# Patient Record
Sex: Female | Born: 1945 | Race: White | Hispanic: No | Smoking: Former smoker
Health system: Southern US, Community
[De-identification: ages and names within clinical notes are randomized; demographics above are authoritative.]

## PROBLEM LIST (undated history)

## (undated) DIAGNOSIS — J449 Chronic obstructive pulmonary disease, unspecified: Secondary | ICD-10-CM

## (undated) DIAGNOSIS — E785 Hyperlipidemia, unspecified: Secondary | ICD-10-CM

## (undated) DIAGNOSIS — R0602 Shortness of breath: Secondary | ICD-10-CM

## (undated) DIAGNOSIS — I10 Essential (primary) hypertension: Secondary | ICD-10-CM

## (undated) DIAGNOSIS — R079 Chest pain, unspecified: Secondary | ICD-10-CM

## (undated) HISTORY — PX: ABDOMINAL HYSTERECTOMY: SHX81

## (undated) HISTORY — DX: Essential (primary) hypertension: I10

## (undated) HISTORY — DX: Hyperlipidemia, unspecified: E78.5

## (undated) HISTORY — DX: Shortness of breath: R06.02

## (undated) HISTORY — DX: Chest pain, unspecified: R07.9

---

## 1999-08-08 ENCOUNTER — Other Ambulatory Visit: Admission: RE | Admit: 1999-08-08 | Discharge: 1999-08-08 | Payer: Self-pay | Admitting: Obstetrics and Gynecology

## 1999-08-18 ENCOUNTER — Ambulatory Visit (HOSPITAL_COMMUNITY): Admission: RE | Admit: 1999-08-18 | Discharge: 1999-08-18 | Payer: Self-pay | Admitting: Obstetrics and Gynecology

## 1999-08-18 ENCOUNTER — Encounter: Payer: Self-pay | Admitting: Obstetrics and Gynecology

## 2001-01-24 ENCOUNTER — Other Ambulatory Visit: Admission: RE | Admit: 2001-01-24 | Discharge: 2001-01-24 | Payer: Self-pay | Admitting: Obstetrics and Gynecology

## 2001-02-05 ENCOUNTER — Ambulatory Visit (HOSPITAL_COMMUNITY): Admission: RE | Admit: 2001-02-05 | Discharge: 2001-02-05 | Payer: Self-pay | Admitting: Obstetrics and Gynecology

## 2001-02-05 ENCOUNTER — Encounter: Payer: Self-pay | Admitting: Obstetrics and Gynecology

## 2011-03-16 ENCOUNTER — Other Ambulatory Visit (HOSPITAL_COMMUNITY): Payer: Self-pay | Admitting: Pulmonary Disease

## 2011-03-16 ENCOUNTER — Ambulatory Visit (HOSPITAL_COMMUNITY)
Admission: RE | Admit: 2011-03-16 | Discharge: 2011-03-16 | Disposition: A | Discharge status: Home / Self Care | Payer: Medicare HMO | Source: Ambulatory Visit | Attending: Pulmonary Disease | Admitting: Pulmonary Disease

## 2011-03-16 DIAGNOSIS — M542 Cervicalgia: Secondary | ICD-10-CM | POA: Insufficient documentation

## 2011-03-16 DIAGNOSIS — R52 Pain, unspecified: Secondary | ICD-10-CM

## 2011-03-16 DIAGNOSIS — M5030 Other cervical disc degeneration, unspecified cervical region: Secondary | ICD-10-CM | POA: Insufficient documentation

## 2011-03-16 DIAGNOSIS — D1779 Benign lipomatous neoplasm of other sites: Secondary | ICD-10-CM | POA: Insufficient documentation

## 2011-03-16 DIAGNOSIS — R079 Chest pain, unspecified: Secondary | ICD-10-CM | POA: Insufficient documentation

## 2011-03-16 DIAGNOSIS — M546 Pain in thoracic spine: Secondary | ICD-10-CM | POA: Insufficient documentation

## 2011-03-16 DIAGNOSIS — M47812 Spondylosis without myelopathy or radiculopathy, cervical region: Secondary | ICD-10-CM | POA: Insufficient documentation

## 2011-04-12 ENCOUNTER — Encounter (HOSPITAL_BASED_OUTPATIENT_CLINIC_OR_DEPARTMENT_OTHER)
Admission: RE | Admit: 2011-04-12 | Discharge: 2011-04-12 | Disposition: A | Discharge status: Home / Self Care | Payer: Medicare HMO | Source: Ambulatory Visit | Attending: Specialist | Admitting: Specialist

## 2011-04-12 LAB — DIFFERENTIAL
Basophils Absolute: 0 10*3/uL (ref 0.0–0.1)
Basophils Relative: 2 % — ABNORMAL HIGH (ref 0–1)
Lymphocytes Relative: 27 % (ref 12–46)
Monocytes Relative: 10 % (ref 3–12)
Neutro Abs: 1.5 10*3/uL — ABNORMAL LOW (ref 1.7–7.7)
Neutrophils Relative %: 57 % (ref 43–77)

## 2011-04-12 LAB — CBC
Hemoglobin: 13.2 g/dL (ref 12.0–15.0)
RBC: 4.33 MIL/uL (ref 3.87–5.11)

## 2011-04-12 LAB — BASIC METABOLIC PANEL
CO2: 26 mEq/L (ref 19–32)
Glucose, Bld: 129 mg/dL — ABNORMAL HIGH (ref 70–99)
Potassium: 3.9 mEq/L (ref 3.5–5.1)
Sodium: 137 mEq/L (ref 135–145)

## 2011-04-16 ENCOUNTER — Other Ambulatory Visit: Payer: Self-pay | Admitting: Specialist

## 2011-04-16 ENCOUNTER — Ambulatory Visit (HOSPITAL_BASED_OUTPATIENT_CLINIC_OR_DEPARTMENT_OTHER)
Admission: RE | Admit: 2011-04-16 | Discharge: 2011-04-16 | Disposition: A | Discharge status: Home / Self Care | Payer: Medicare HMO | Source: Ambulatory Visit | Attending: Specialist | Admitting: Specialist

## 2011-04-16 DIAGNOSIS — D1739 Benign lipomatous neoplasm of skin and subcutaneous tissue of other sites: Secondary | ICD-10-CM | POA: Insufficient documentation

## 2011-04-16 DIAGNOSIS — Z01812 Encounter for preprocedural laboratory examination: Secondary | ICD-10-CM | POA: Insufficient documentation

## 2011-04-16 DIAGNOSIS — E669 Obesity, unspecified: Secondary | ICD-10-CM | POA: Insufficient documentation

## 2011-04-16 DIAGNOSIS — I10 Essential (primary) hypertension: Secondary | ICD-10-CM | POA: Insufficient documentation

## 2011-06-04 NOTE — Op Note (Signed)
  [name], Donna Duran                   ACCOUNT NO.:  [id number]  MEDICAL RECORD NO.:  [id number]  LOCATION:                                 FACILITY:  PHYSICIAN:  Earvin Hansen L. Helen Cuff, M.D.DATE OF BIRTH:  02-Nov-1945  DATE OF PROCEDURE:  04/16/2011 DATE OF DISCHARGE:                              OPERATIVE REPORT   INDICATIONS:  A 65-year lady with severe masses involving her upper mid back areas x2.  Each area measured approximately 8-10 inches across x 8- 10 inches in width.  Increased pain, discomfort the patient says when she lies on them.  Now she has had a lot of pain and the pressure put against these areas also cause her not to be able to flex her neck and back and upper portions as she wishes to.  This is also interfering with her when she drives her car.  PROCEDURES DONE:  Excision of masses of the back, plastic closure.  ANESTHESIA:  General.  DESCRIPTION OF PROCEDURE:  The patient underwent general anesthesia,intubated orally.  She was then placed in a prone position, protected. Prep was done to the back areas with Hibiclens soap and solution, walled off with sterile towels and drapes so as to make a sterile field. Tumescent was used to inject with then the spaces of the 2 masses using approximately 700 mL, able to make an incision, dissect down the skin and subcutaneous tissue to the mass which was subfascial.  I used Metzenbaum scissors to elevate and dissect out and then hemostasis maintained with Bovie anticoagulation.  Flaps then rotated.  The liposuction machine was then used to buff around the edges of the area still removing approximately 500 more mL of tissue from this region using a [place] catheter __________.  We were able to get the mass removed completely flat.  The wound was closed with 3-0 Monocryl x2 layers and a running subcuticular stitch of 3-0 Monocryl leaving the part of the edges opened for drainage.  The wounds were covered with 4x4s, ABDs, Hypafix  tape for pressure dressing.  She withstood the procedures very well and was taken to recovery in excellent condition.     Yaakov Guthrie. Shon Hough, M.D.     Cathie Hoops  D:  04/16/2011  T:  04/16/2011  Job:  [id number]  Electronically Signed by Louisa Second M.D. on 06/04/2011 07:09:39 PM

## 2011-09-18 ENCOUNTER — Other Ambulatory Visit (HOSPITAL_COMMUNITY): Payer: Self-pay | Admitting: Pulmonary Disease

## 2011-09-18 DIAGNOSIS — Z1231 Encounter for screening mammogram for malignant neoplasm of breast: Secondary | ICD-10-CM

## 2011-10-11 ENCOUNTER — Ambulatory Visit (HOSPITAL_COMMUNITY): Payer: Medicare HMO

## 2011-12-28 ENCOUNTER — Other Ambulatory Visit: Payer: Self-pay | Admitting: Gastroenterology

## 2012-11-10 ENCOUNTER — Encounter: Payer: Self-pay | Admitting: *Deleted

## 2012-11-11 ENCOUNTER — Encounter: Payer: Self-pay | Admitting: Internal Medicine

## 2012-12-09 ENCOUNTER — Other Ambulatory Visit (HOSPITAL_COMMUNITY): Payer: Self-pay | Admitting: Internal Medicine

## 2012-12-09 DIAGNOSIS — M7120 Synovial cyst of popliteal space [Baker], unspecified knee: Secondary | ICD-10-CM

## 2012-12-11 ENCOUNTER — Ambulatory Visit (HOSPITAL_COMMUNITY)
Admission: RE | Admit: 2012-12-11 | Discharge: 2012-12-11 | Disposition: A | Discharge status: Home / Self Care | Payer: Medicare HMO | Source: Ambulatory Visit | Attending: Cardiovascular Disease | Admitting: Cardiovascular Disease

## 2012-12-11 DIAGNOSIS — M7120 Synovial cyst of popliteal space [Baker], unspecified knee: Secondary | ICD-10-CM | POA: Insufficient documentation

## 2012-12-11 NOTE — Progress Notes (Signed)
Lower Extremity Venous  Insufficiency Duplex Completed. Positive for reflux in the left greater saphenous vein in the distal thigh and proximal calf region. Donna Duran

## 2012-12-31 ENCOUNTER — Ambulatory Visit (INDEPENDENT_AMBULATORY_CARE_PROVIDER_SITE_OTHER): Payer: Medicare HMO | Admitting: Internal Medicine

## 2012-12-31 ENCOUNTER — Encounter: Payer: Self-pay | Admitting: Internal Medicine

## 2012-12-31 VITALS — BP 120/80 | HR 72 | Ht 62.5 in | Wt 232.1 lb

## 2012-12-31 DIAGNOSIS — E785 Hyperlipidemia, unspecified: Secondary | ICD-10-CM

## 2012-12-31 DIAGNOSIS — I83893 Varicose veins of bilateral lower extremities with other complications: Secondary | ICD-10-CM

## 2012-12-31 DIAGNOSIS — I10 Essential (primary) hypertension: Secondary | ICD-10-CM

## 2012-12-31 NOTE — Patient Instructions (Signed)
Your physician recommends that you schedule a follow-up appointment in 1 month   Wear left leg compression stocking daily and remove at night.    Left ankle 8 inches        Left calf  16.75

## 2013-01-01 ENCOUNTER — Encounter: Payer: Self-pay | Admitting: Internal Medicine

## 2013-01-01 DIAGNOSIS — E785 Hyperlipidemia, unspecified: Secondary | ICD-10-CM | POA: Insufficient documentation

## 2013-01-01 DIAGNOSIS — I83893 Varicose veins of bilateral lower extremities with other complications: Secondary | ICD-10-CM | POA: Insufficient documentation

## 2013-01-01 DIAGNOSIS — I10 Essential (primary) hypertension: Secondary | ICD-10-CM | POA: Insufficient documentation

## 2013-01-01 NOTE — Progress Notes (Signed)
OFFICE NOTE  Chief Complaint:  Followup of venous insufficiency study  Primary Care Physician: Donna Farber, MD  HPI:  Donna Duran  Is a 67 year old female who was previously followed by Dr. Clarene Duran for a history of hypertension and dyslipidemia. She also has morbid obesity and bilateral osteoarthritis and a left Baker's cyst. She has been seen by Dr. Cleophas Duran for her knees and has been injected but is not thought to need knee replacement at this time. She continues to have leg pain bilaterally and feels that it may be a circulatory problem and is here to investigate that today. She did have stigmata of chronic venous insufficiency, and I recommended venous Dopplers. Venous Dopplers demonstrated a small area of venous reflux in the left greater saphenous vein between the calf and just to above the knee. This was a small vessel, measuring just over 3 mm. However the greater saphenous vein is up to 6-7 mm in size in the mid thigh.  We discussed her symptoms which include left leg pain, it is difficult to know if this is related to the small Baker cyst which was also noted during ultrasound, measuring about 1 x 3 cm. There was no evidence of cyst rupture.    PMHx:  Past Medical History  Diagnosis Date  . Chest pain     nuclear study 02-2010 that was negative for ischemia, EF 70%  . HTN (hypertension)   . Hyperlipemia   . SOB (shortness of breath)     copd    History reviewed. No pertinent past surgical history.  FAMHx:  Family History  Problem Relation Age of Onset  . Heart failure Mother   . Cancer Paternal Grandfather   . Hypertension Brother   . COPD Sister     SOCHx:   reports that she quit smoking about 30 years ago. Her smoking use included Cigarettes. She smoked 0.00 packs per day. She does not have any smokeless tobacco history on file. Her alcohol and drug histories are not on file.  ALLERGIES:  Allergies  Allergen Reactions  . Morphine And Related      ROS: Pertinent items are noted in HPI.  HOME MEDS: Current Outpatient Prescriptions  Medication Sig Dispense Refill  . b complex vitamins tablet Take 1 tablet by mouth daily.      Marland Kitchen BIOTIN PO Take 1 tablet by mouth.      . Cholecalciferol (VITAMIN D) 2000 UNITS tablet Take 2,000 Units by mouth daily.      . enalapril (VASOTEC) 20 MG tablet Take 20 mg by mouth daily.      Marland Kitchen FLUoxetine (PROZAC) 20 MG capsule Take 20 mg by mouth. Takes 2 tablets daily      . pravastatin (PRAVACHOL) 40 MG tablet Take 40 mg by mouth daily.      Marland Kitchen tiotropium (SPIRIVA) 18 MCG inhalation capsule Place 18 mcg into inhaler and inhale daily.      . traMADol (ULTRAM) 50 MG tablet Take 50 mg by mouth as needed for pain.      . vitamin E (VITAMIN E) 400 UNIT capsule Take 400 Units by mouth daily.      Marland Kitchen zolpidem (AMBIEN) 10 MG tablet Take 10 mg by mouth at bedtime as needed for sleep.       No current facility-administered medications for this visit.    LABS/IMAGING: No results found for this or any previous visit (from the past 48 hour(s)). No results found.  VITALS: BP 120/80  Pulse 72  Ht 5' 2.5" (1.588 m)  Wt 232 lb 1.6 oz (105.28 kg)  BMI 41.75 kg/m2  EXAM: Focus physical exam the lower extremities demonstrate swelling in the left leg with left leg pain, fullness in the popliteal area and varicosities with reticular veins  EKG: deferred  ASSESSMENT: 1. CEAP 1,3 venous insufficiency, greater in the left leg  PLAN: 1.   Ms. Donna Duran has significant reflux in the left greater saphenous vein, however the vessel is small and this could represent a perforator. She does report symptoms in that area which could be consistent with reflux disease. We discussed possible treatment options including venous ablation. For now she wishes to try compression stocking and  we'll go ahead and fit her with that. Plan is to bring her back in a month to see if she's had benefit from the compression stocking and if she is  now interested in venous ablation.  Donna Nose, MD, [facility] Attending Cardiologist [facility]  Donna Duran 01/01/2013, 7:19 PM

## 2013-01-12 ENCOUNTER — Other Ambulatory Visit: Payer: Self-pay | Admitting: Pulmonary Disease

## 2013-01-12 DIAGNOSIS — Z1231 Encounter for screening mammogram for malignant neoplasm of breast: Secondary | ICD-10-CM

## 2013-01-28 ENCOUNTER — Ambulatory Visit (INDEPENDENT_AMBULATORY_CARE_PROVIDER_SITE_OTHER): Payer: Medicare HMO | Admitting: Internal Medicine

## 2013-01-28 ENCOUNTER — Encounter: Payer: Self-pay | Admitting: Internal Medicine

## 2013-01-28 VITALS — BP 132/84 | HR 61 | Ht 63.0 in | Wt 234.3 lb

## 2013-01-28 DIAGNOSIS — I83893 Varicose veins of bilateral lower extremities with other complications: Secondary | ICD-10-CM

## 2013-01-28 DIAGNOSIS — R079 Chest pain, unspecified: Secondary | ICD-10-CM

## 2013-01-28 MED ORDER — DIAZEPAM 5 MG PO TABS: ORAL_TABLET | ORAL | Status: DC

## 2013-01-28 NOTE — Progress Notes (Signed)
OFFICE NOTE  Chief Complaint:  Followup of venous insufficiency study  Primary Care Physician: Eino Farber, MD  HPI:  Donna Duran  Is a 67 year old female who was previously followed by Dr. Clarene Duke for a history of hypertension and dyslipidemia. She also has morbid obesity and bilateral osteoarthritis and a left Baker's cyst. She has been seen by Dr. Cleophas Dunker for her knees and has been injected but is not thought to need knee replacement at this time. She continues to have leg pain bilaterally and feels that it may be a circulatory problem and is here to investigate that today. She did have stigmata of chronic venous insufficiency, and I recommended venous Dopplers. Venous Dopplers demonstrated a small area of venous reflux in the left greater saphenous vein between the calf and just to above the knee. This was a small vessel, measuring just over 3 mm. However the greater saphenous vein is up to 6-7 mm in size in the mid thigh.  We discussed her symptoms which include left leg pain, it is difficult to know if this is related to the small Baker cyst which was also noted during ultrasound, measuring about 1 x 3 cm. There was no evidence of cyst rupture.    PMHx:  Past Medical History  Diagnosis Date  . Chest pain     nuclear study 02-2010 that was negative for ischemia, EF 70%  . HTN (hypertension)   . Hyperlipemia   . SOB (shortness of breath)     copd    History reviewed. No pertinent past surgical history.  FAMHx:  Family History  Problem Relation Age of Onset  . Heart failure Mother   . Cancer Paternal Grandfather   . Hypertension Brother   . COPD Sister     SOCHx:   reports that she quit smoking about 30 years ago. Her smoking use included Cigarettes. She smoked 0.00 packs per day. She does not have any smokeless tobacco history on file. Her alcohol and drug histories are not on file.  ALLERGIES:  Allergies  Allergen Reactions  . Morphine And Related      ROS: Pertinent items are noted in HPI.  HOME MEDS: Current Outpatient Prescriptions  Medication Sig Dispense Refill  . b complex vitamins tablet Take 1 tablet by mouth daily.      Marland Kitchen BIOTIN PO Take 1 tablet by mouth.      . Cholecalciferol (VITAMIN D) 2000 UNITS tablet Take 2,000 Units by mouth daily.      . enalapril (VASOTEC) 20 MG tablet Take 20 mg by mouth daily.      Marland Kitchen FLUoxetine (PROZAC) 20 MG capsule Take 20 mg by mouth. Takes 2 tablets daily      . pravastatin (PRAVACHOL) 40 MG tablet Take 40 mg by mouth daily.      Marland Kitchen tiotropium (SPIRIVA) 18 MCG inhalation capsule Place 18 mcg into inhaler and inhale daily.      . traMADol (ULTRAM) 50 MG tablet Take 50 mg by mouth as needed for pain.      . vitamin E (VITAMIN E) 400 UNIT capsule Take 400 Units by mouth daily.      Marland Kitchen zolpidem (AMBIEN) 10 MG tablet Take 10 mg by mouth at bedtime as needed for sleep.      . diazepam (VALIUM) 5 MG tablet Take 1 tablet (5mg ) 2 hours prior to procedure.  1 tablet  0   No current facility-administered medications for this visit.    LABS/IMAGING: No  results found for this or any previous visit (from the past 48 hour(s)). No results found.  VITALS: BP 132/84  Pulse 61  Ht 5\' 3"  (1.6 m)  Wt 234 lb 4.8 oz (106.278 kg)  BMI 41.51 kg/m2  EXAM: Focus physical exam the lower extremities demonstrate swelling in the left leg with left leg pain, fullness in the popliteal area and varicosities with reticular veins  EKG: deferred  ASSESSMENT: 1. CEAP 1,3 venous insufficiency, greater in the left leg  PLAN: 1.   Ms. Cremeans has significant reflux in the left greater saphenous vein and has had some improvement with a compression stocking. She is now interested in venous ablation, due to the fact that she is at swelling in her left leg mostly when up and her feet. This is also accompanied by pain and heaviness. We discussed the risk and benefits of the procedure today and she wishes to proceed. She did  provide informed consent and will protrude insurance company for preauthorization. I suspect we can schedule her with our pain clinic on August 6.  Chrystie Nose, MD, [facility] Attending Cardiologist [facility]  Abdalla Naramore C 01/28/2013, 6:19 PM

## 2013-01-28 NOTE — Patient Instructions (Addendum)
Dr. Rennis Golden has ordered a venous ablation. You have a prescription for Valium 5mg  - please take this 2 hours prior to the procedure. You will need to have someone drive you to and from this procedure.

## 2013-02-09 ENCOUNTER — Ambulatory Visit
Admission: RE | Admit: 2013-02-09 | Discharge: 2013-02-09 | Disposition: A | Discharge status: Home / Self Care | Payer: Medicare HMO | Source: Ambulatory Visit | Attending: Pulmonary Disease | Admitting: Pulmonary Disease

## 2013-02-09 DIAGNOSIS — Z1231 Encounter for screening mammogram for malignant neoplasm of breast: Secondary | ICD-10-CM

## 2013-02-26 ENCOUNTER — Other Ambulatory Visit: Payer: Self-pay | Admitting: *Deleted

## 2013-02-26 DIAGNOSIS — I83893 Varicose veins of bilateral lower extremities with other complications: Secondary | ICD-10-CM

## 2013-03-04 ENCOUNTER — Encounter: Payer: Medicare HMO | Admitting: Internal Medicine

## 2013-03-06 ENCOUNTER — Encounter (HOSPITAL_COMMUNITY): Payer: Medicare HMO

## 2013-03-23 ENCOUNTER — Ambulatory Visit: Payer: Medicare HMO | Admitting: Internal Medicine

## 2013-03-24 ENCOUNTER — Ambulatory Visit: Payer: Medicare HMO | Admitting: Internal Medicine

## 2013-03-24 ENCOUNTER — Other Ambulatory Visit (HOSPITAL_COMMUNITY): Payer: Self-pay | Admitting: Pulmonary Disease

## 2013-03-24 DIAGNOSIS — M25569 Pain in unspecified knee: Secondary | ICD-10-CM

## 2013-03-25 ENCOUNTER — Ambulatory Visit (HOSPITAL_COMMUNITY)
Admission: RE | Admit: 2013-03-25 | Discharge: 2013-03-25 | Disposition: A | Discharge status: Home / Self Care | Payer: Medicare HMO | Source: Ambulatory Visit | Attending: Pulmonary Disease | Admitting: Pulmonary Disease

## 2013-03-25 ENCOUNTER — Other Ambulatory Visit (HOSPITAL_COMMUNITY): Payer: Self-pay | Admitting: Pulmonary Disease

## 2013-03-25 DIAGNOSIS — M25569 Pain in unspecified knee: Secondary | ICD-10-CM

## 2013-03-25 DIAGNOSIS — R52 Pain, unspecified: Secondary | ICD-10-CM

## 2013-03-25 DIAGNOSIS — IMO0002 Reserved for concepts with insufficient information to code with codable children: Secondary | ICD-10-CM | POA: Insufficient documentation

## 2013-03-25 DIAGNOSIS — E785 Hyperlipidemia, unspecified: Secondary | ICD-10-CM | POA: Insufficient documentation

## 2013-03-25 DIAGNOSIS — M79609 Pain in unspecified limb: Secondary | ICD-10-CM

## 2013-03-25 DIAGNOSIS — I70219 Atherosclerosis of native arteries of extremities with intermittent claudication, unspecified extremity: Secondary | ICD-10-CM

## 2013-03-25 DIAGNOSIS — M5380 Other specified dorsopathies, site unspecified: Secondary | ICD-10-CM | POA: Insufficient documentation

## 2013-03-25 DIAGNOSIS — I10 Essential (primary) hypertension: Secondary | ICD-10-CM | POA: Insufficient documentation

## 2013-03-25 NOTE — Progress Notes (Signed)
VASCULAR LAB PRELIMINARY  ARTERIAL  ABI completed:    RIGHT    LEFT    PRESSURE WAVEFORM  PRESSURE WAVEFORM  BRACHIAL 157 Biphasic BRACHIAL 156 Triphasic  DP 145 Biphasic DP 150 Biphasic  AT   AT    PT 156 Biphasic PT 139 Monophasic  PER   PER    GREAT TOE  NA GREAT TOE  NA    RIGHT LEFT  ABI 0.99 0.96    Bilateral ABIs are within normal limits.  03/25/2013 2:38 PM Gertie Fey, RVT, RDCS, RDMS

## 2013-04-01 ENCOUNTER — Ambulatory Visit: Payer: Medicare HMO | Admitting: Internal Medicine

## 2013-04-15 ENCOUNTER — Other Ambulatory Visit: Payer: Self-pay | Admitting: Pulmonary Disease

## 2013-04-15 DIAGNOSIS — G548 Other nerve root and plexus disorders: Secondary | ICD-10-CM

## 2013-04-21 ENCOUNTER — Encounter: Payer: Medicare HMO | Admitting: Internal Medicine

## 2013-04-22 ENCOUNTER — Other Ambulatory Visit: Payer: Medicare HMO

## 2013-04-23 ENCOUNTER — Encounter (HOSPITAL_COMMUNITY): Payer: Medicare HMO

## 2013-04-23 ENCOUNTER — Other Ambulatory Visit: Payer: Medicare HMO

## 2013-05-04 ENCOUNTER — Ambulatory Visit
Admission: RE | Admit: 2013-05-04 | Discharge: 2013-05-04 | Disposition: A | Discharge status: Home / Self Care | Payer: Medicare HMO | Source: Ambulatory Visit | Attending: Pulmonary Disease | Admitting: Pulmonary Disease

## 2013-05-04 DIAGNOSIS — G548 Other nerve root and plexus disorders: Secondary | ICD-10-CM

## 2013-05-04 MED ORDER — GADOBENATE DIMEGLUMINE 529 MG/ML IV SOLN
20.0000 mL | Freq: Once | INTRAVENOUS | Status: AC | PRN
  Administered 2013-05-04: 20 mL via INTRAVENOUS

## 2013-05-13 ENCOUNTER — Ambulatory Visit: Payer: Medicare HMO | Admitting: Internal Medicine

## 2014-02-02 ENCOUNTER — Other Ambulatory Visit: Payer: Self-pay | Admitting: Pulmonary Disease

## 2014-02-02 DIAGNOSIS — Z1231 Encounter for screening mammogram for malignant neoplasm of breast: Secondary | ICD-10-CM

## 2014-02-04 ENCOUNTER — Telehealth: Payer: Self-pay | Admitting: Internal Medicine

## 2014-02-04 NOTE — Telephone Encounter (Signed)
Closed encounter °

## 2014-02-10 ENCOUNTER — Ambulatory Visit
Admission: RE | Admit: 2014-02-10 | Discharge: 2014-02-10 | Disposition: A | Discharge status: Home / Self Care | Payer: Commercial Managed Care - HMO | Source: Ambulatory Visit | Attending: Pulmonary Disease | Admitting: Pulmonary Disease

## 2014-02-10 DIAGNOSIS — Z1231 Encounter for screening mammogram for malignant neoplasm of breast: Secondary | ICD-10-CM

## 2014-02-12 ENCOUNTER — Encounter: Payer: Self-pay | Admitting: Internal Medicine

## 2014-02-12 ENCOUNTER — Ambulatory Visit (INDEPENDENT_AMBULATORY_CARE_PROVIDER_SITE_OTHER): Payer: Medicare HMO | Admitting: Internal Medicine

## 2014-02-12 VITALS — BP 150/70 | HR 62 | Ht 62.0 in | Wt 230.5 lb

## 2014-02-12 DIAGNOSIS — I83893 Varicose veins of bilateral lower extremities with other complications: Secondary | ICD-10-CM

## 2014-02-12 DIAGNOSIS — R0609 Other forms of dyspnea: Secondary | ICD-10-CM

## 2014-02-12 DIAGNOSIS — I10 Essential (primary) hypertension: Secondary | ICD-10-CM

## 2014-02-12 DIAGNOSIS — R0602 Shortness of breath: Secondary | ICD-10-CM | POA: Insufficient documentation

## 2014-02-12 DIAGNOSIS — E785 Hyperlipidemia, unspecified: Secondary | ICD-10-CM

## 2014-02-12 DIAGNOSIS — R0989 Other specified symptoms and signs involving the circulatory and respiratory systems: Secondary | ICD-10-CM

## 2014-02-12 NOTE — Progress Notes (Signed)
OFFICE NOTE  Chief Complaint:  Followup of venous insufficiency study  Primary Care Physician: Leola Brazil, MD  HPI:  Donna Duran  Is a 68 year old female who was previously followed by Dr. Rex Kras for a history of hypertension and dyslipidemia. She also has morbid obesity and bilateral osteoarthritis and a left Baker's cyst. She has been seen by Dr. Durward Fortes for her knees and has been injected but is not thought to need knee replacement at this time. She continues to have leg pain bilaterally and feels that it may be a circulatory problem and is here to investigate that today. She did have stigmata of chronic venous insufficiency, and I recommended venous Dopplers. Venous Dopplers demonstrated a small area of venous reflux in the left greater saphenous vein between the calf and just to above the knee. This was a small vessel, measuring just over 3 mm. However the greater saphenous vein is up to 6-7 mm in size in the mid thigh.  We discussed her symptoms which include left leg pain, it is difficult to know if this is related to the small Baker cyst which was also noted during ultrasound, measuring about 1 x 3 cm. There was no evidence of cyst rupture.    Donna Duran returns today for followup. She reports a short while ago she had an episode of a significant GI upset. She was sick for about a week and then presented to her primary care doctor. They did an EKG and told her that her "heart was strained". She denied any chest pain he has had no further episodes since then. She does get short of breath but this is stable for her and not worsening. She reports her cholesterol recently checked her total cholesterol was just over 100 which represents very good control.  PMHx:  Past Medical History  Diagnosis Date  . Chest pain     nuclear study 02-2010 that was negative for ischemia, EF 70%  . HTN (hypertension)   . Hyperlipemia   . SOB (shortness of breath)     copd     History reviewed. No pertinent past surgical history.  FAMHx:  Family History  Problem Relation Age of Onset  . Heart failure Mother   . Cancer Paternal Grandfather   . Hypertension Brother   . COPD Sister     SOCHx:   reports that she quit smoking about 31 years ago. Her smoking use included Cigarettes. She smoked 0.00 packs per day. She does not have any smokeless tobacco history on file. Her alcohol and drug histories are not on file.  ALLERGIES:  Allergies  Allergen Reactions  . Morphine And Related     ROS: A comprehensive review of systems was negative except for: Respiratory: positive for dyspnea on exertion  HOME MEDS: Current Outpatient Prescriptions  Medication Sig Dispense Refill  . b complex vitamins tablet Take 1 tablet by mouth daily.      Marland Kitchen BIOTIN PO Take 1 tablet by mouth.      . Cholecalciferol (VITAMIN D) 2000 UNITS tablet Take 2,000 Units by mouth daily.      . enalapril (VASOTEC) 20 MG tablet Take 20 mg by mouth daily.      Marland Kitchen FLUoxetine (PROZAC) 20 MG capsule Take 20 mg by mouth. Takes 2 tablets daily      . pravastatin (PRAVACHOL) 40 MG tablet Take 40 mg by mouth daily.      Marland Kitchen tiotropium (SPIRIVA) 18 MCG inhalation capsule Place 18  mcg into inhaler and inhale daily.      . traMADol (ULTRAM) 50 MG tablet Take 50 mg by mouth as needed for pain.      . vitamin E (VITAMIN E) 400 UNIT capsule Take 400 Units by mouth daily.      Marland Kitchen zolpidem (AMBIEN) 10 MG tablet Take 10 mg by mouth at bedtime as needed for sleep.       No current facility-administered medications for this visit.    LABS/IMAGING: No results found for this or any previous visit (from the past 48 hour(s)). Mm Digital Screening Bilateral  02/11/2014   CLINICAL DATA:  Screening.  EXAM: DIGITAL SCREENING BILATERAL MAMMOGRAM WITH CAD  COMPARISON:  Previous exam(s)  ACR Breast Density Category a: The breast tissue is almost entirely fatty.  FINDINGS: There are no findings suspicious for  malignancy. Images were processed with CAD.  IMPRESSION: No mammographic evidence of malignancy. A result letter of this screening mammogram will be mailed directly to the patient.  RECOMMENDATION: Screening mammogram in one year. (Code:[id number]-B-01Y)  BI-RADS CATEGORY  1: Negative.   Electronically Signed   By: Hassan Rowan M.D.   On: 02/11/2014 13:58    VITALS: BP 150/70  Pulse 62  Ht 5\' 2"  (1.575 m)  Wt 230 lb 8 oz (104.554 kg)  BMI 42.15 kg/m2  EXAM: General appearance: alert and no distress Neck: no carotid bruit, no JVD and thyroid not enlarged, symmetric, no tenderness/mass/nodules Lungs: clear to auscultation bilaterally Heart: regular rate and rhythm, S1, S2 normal, no murmur, click, rub or gallop Abdomen: soft, non-tender; bowel sounds normal; no masses,  no organomegaly Extremities: extremities normal, atraumatic, no cyanosis or edema Pulses: 2+ and symmetric Skin: Skin color, texture, turgor normal. No rashes or lesions Neurologic: Grossly normal Focus physical exam the lower extremities demonstrate swelling in the left leg with left leg pain, fullness in the popliteal area and varicosities with reticular veins  EKG: Normal sinus rhythm at 62  ASSESSMENT: 1. HTN 2. Dyslipidemia 3. Stable DOE 4. CEAP 1,3 venous insufficiency, greater in the left leg 5. Morbid obesity  PLAN: 1.   [name] is doing well. Her blood pressures controlled. Her cholesterol is at goal. She is stable breathlessness, but no chest pain. She apparently had an abnormal EKG however her EKG today is normal. There is no sign of ischemia. Her last stress test was in 2011 and was negative. I do not see an indication for repeat stress testing at this time. She does have venous insufficiency and we never went ahead and performed a venous ablation however she reports her symptoms are manageable at this time. Plan to see her back annually or sooner as necessary.  Pixie Casino, MD, [facility] Attending Cardiologist The  [place] C 02/12/2014, 3:17 PM

## 2014-02-12 NOTE — Patient Instructions (Signed)
Your physician wants you to follow-up in: 1 year. You will receive a reminder letter in the mail two months in advance. If you don't receive a letter, please call our office to schedule the follow-up appointment.  

## 2015-06-27 IMAGING — CR DG THORACIC SPINE 4+V
3 series · 3 of 3 positions shown · non-contrast
Comparison: None

CLINICAL DATA: Pain between scapulae

THORACIC SPINE - 4+ VIEW

[t t-spine a.p.]
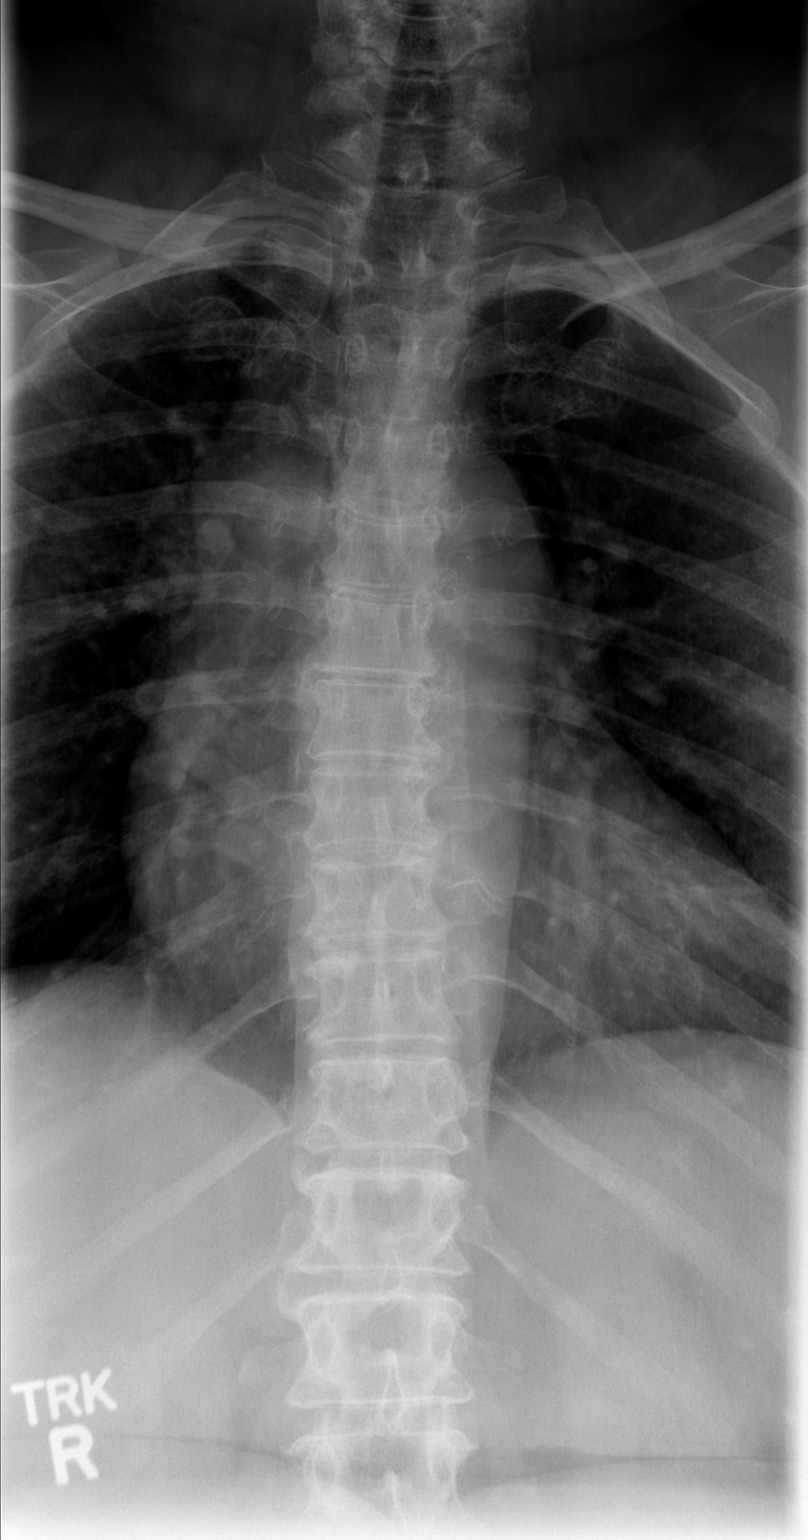

[t t-spine lat]
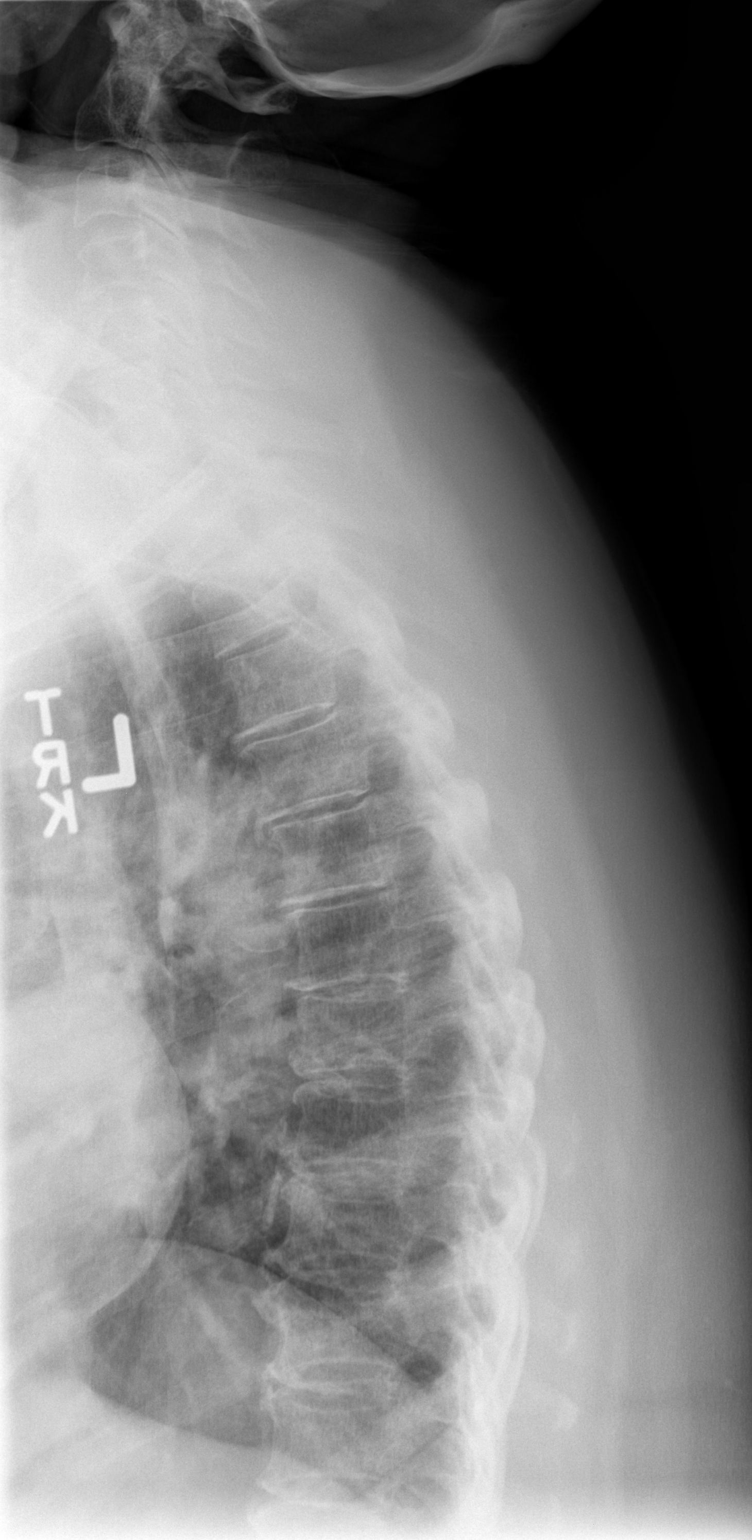

[t swimmers]
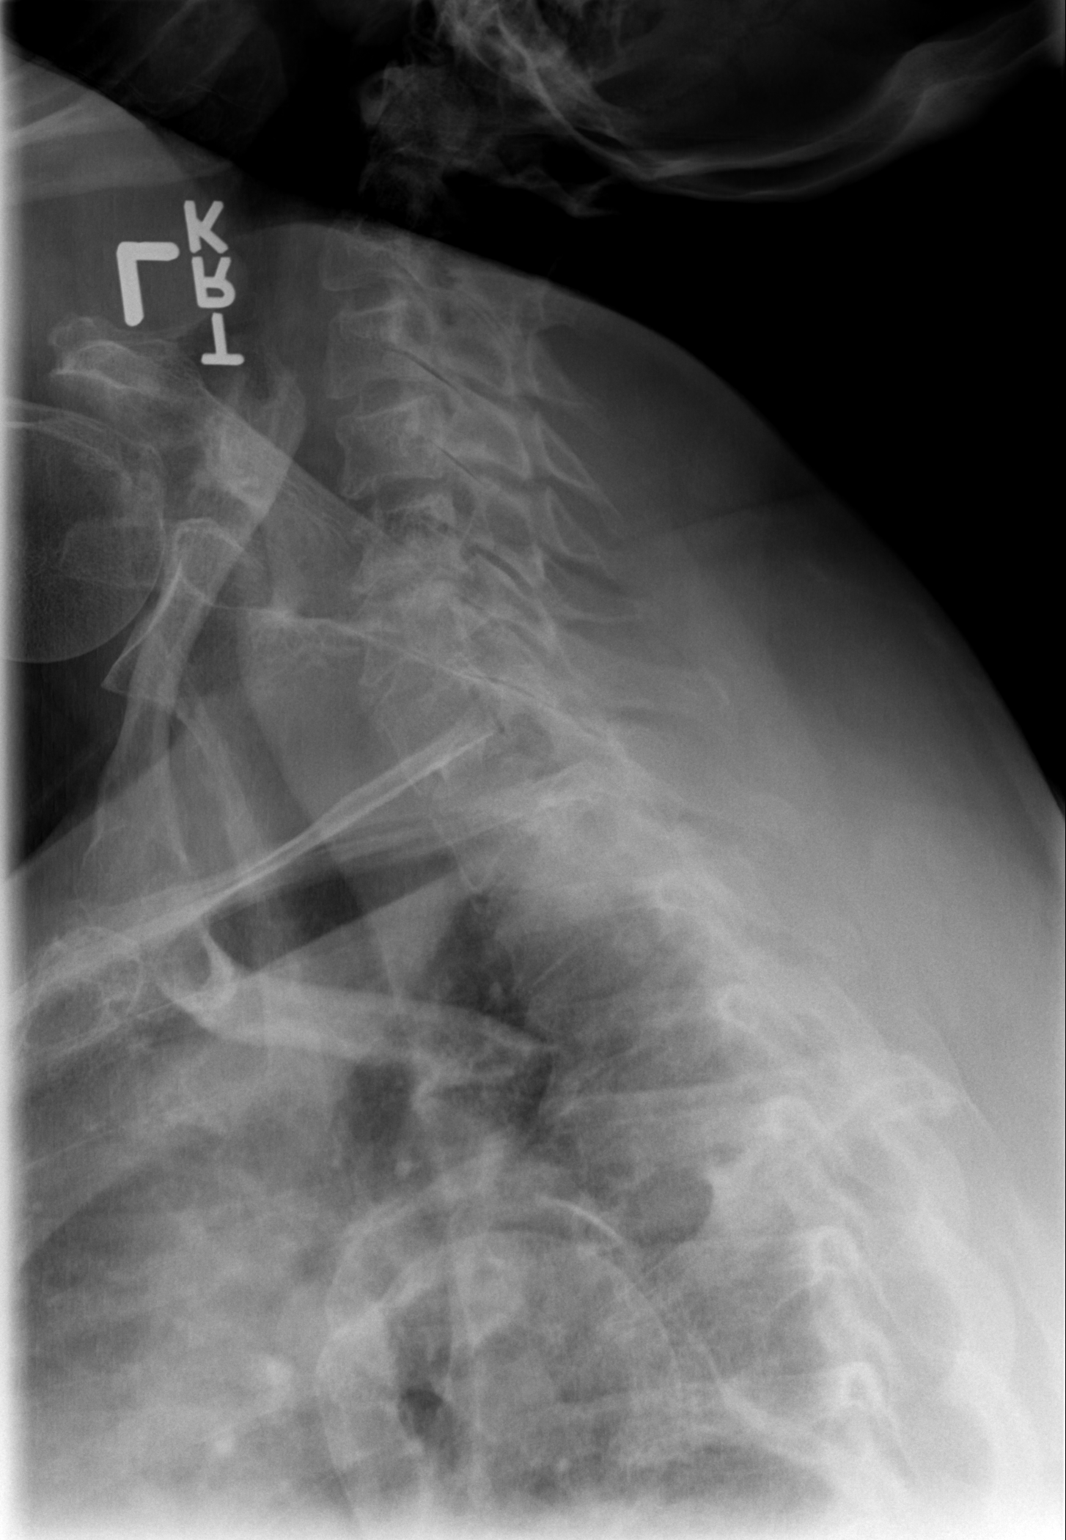

[3 of 3 positions shown; findings below may reference images not displayed]

FINDINGS: 12 pairs of ribs.
Osseous mineralization grossly normal.
Minimal scattered endplate spur formation at mid to inferior
thoracic spine.
Vertebral body heights maintained without fracture or bone
destruction.
Minimal broad-based dextroconvex thoracic scoliosis.
Visualized posterior ribs unremarkable.
IMPRESSION: Minimal degenerative disc disease changes thoracic spine.

## 2015-06-27 IMAGING — CR DG LUMBAR SPINE COMPLETE 4+V
5 series · 5 of 5 positions shown · non-contrast
Comparison: None

CLINICAL DATA: Severe low back pain at L4-L5 and down both legs,
difficulty standing straight and walking, no known injury

LUMBAR SPINE - COMPLETE 4+ VIEW

[t l-spine a.p.]
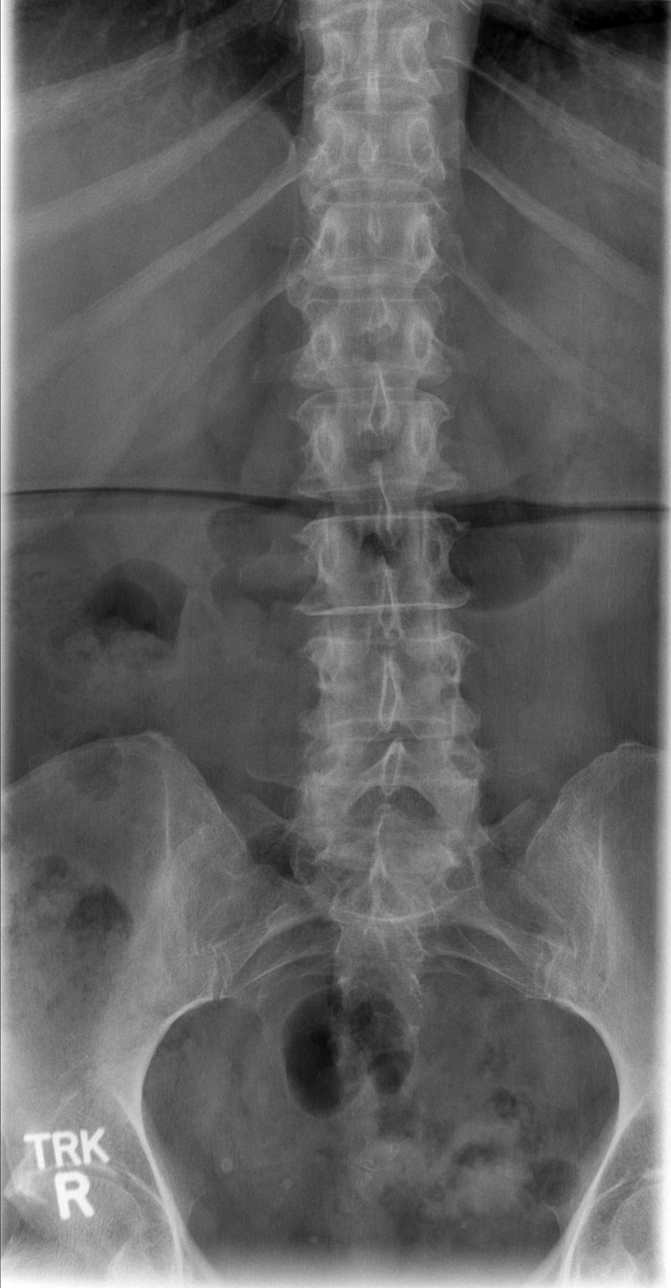

[t l-spine oblique exposure (1 of 2)]
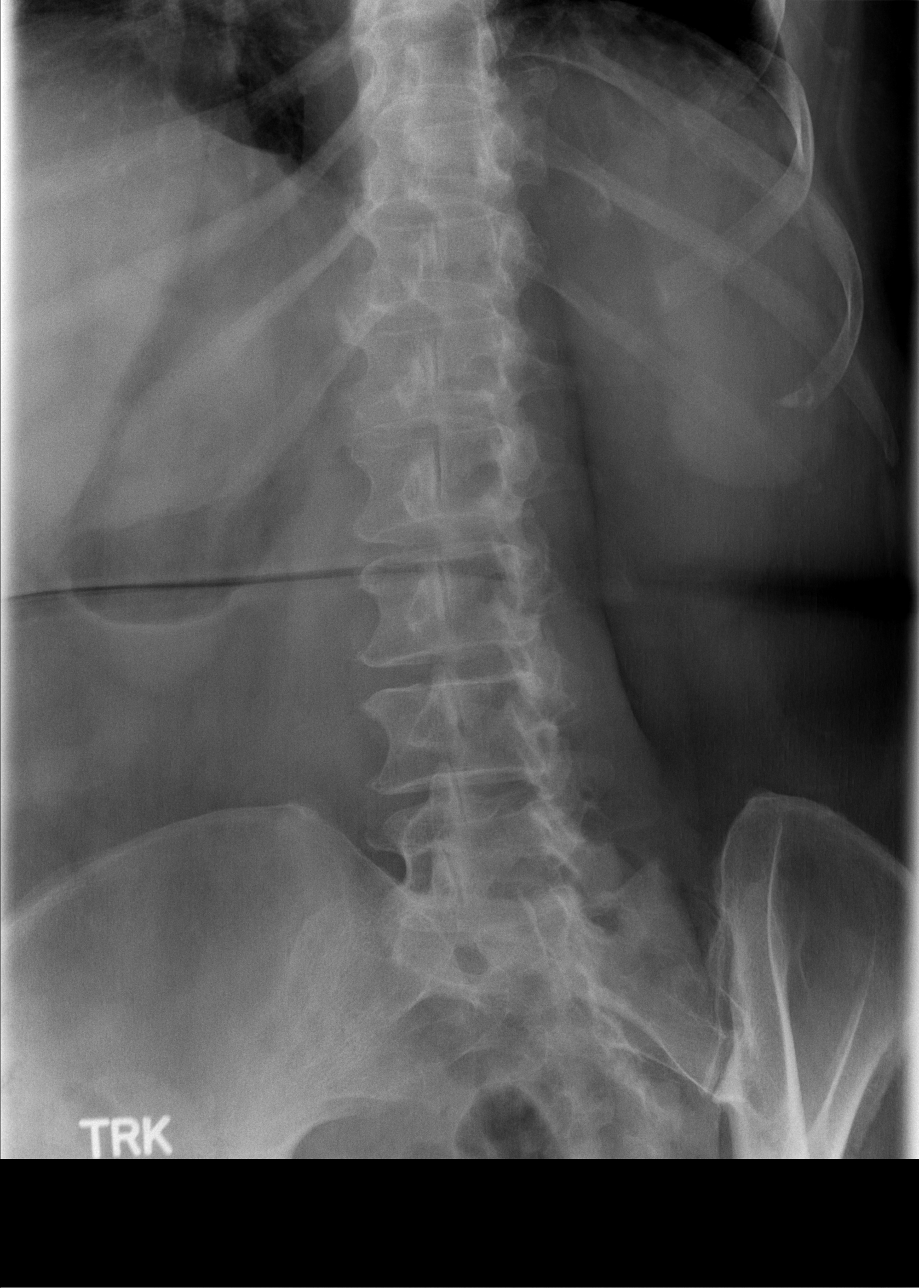

[t l-spine oblique exposure (2 of 2)]
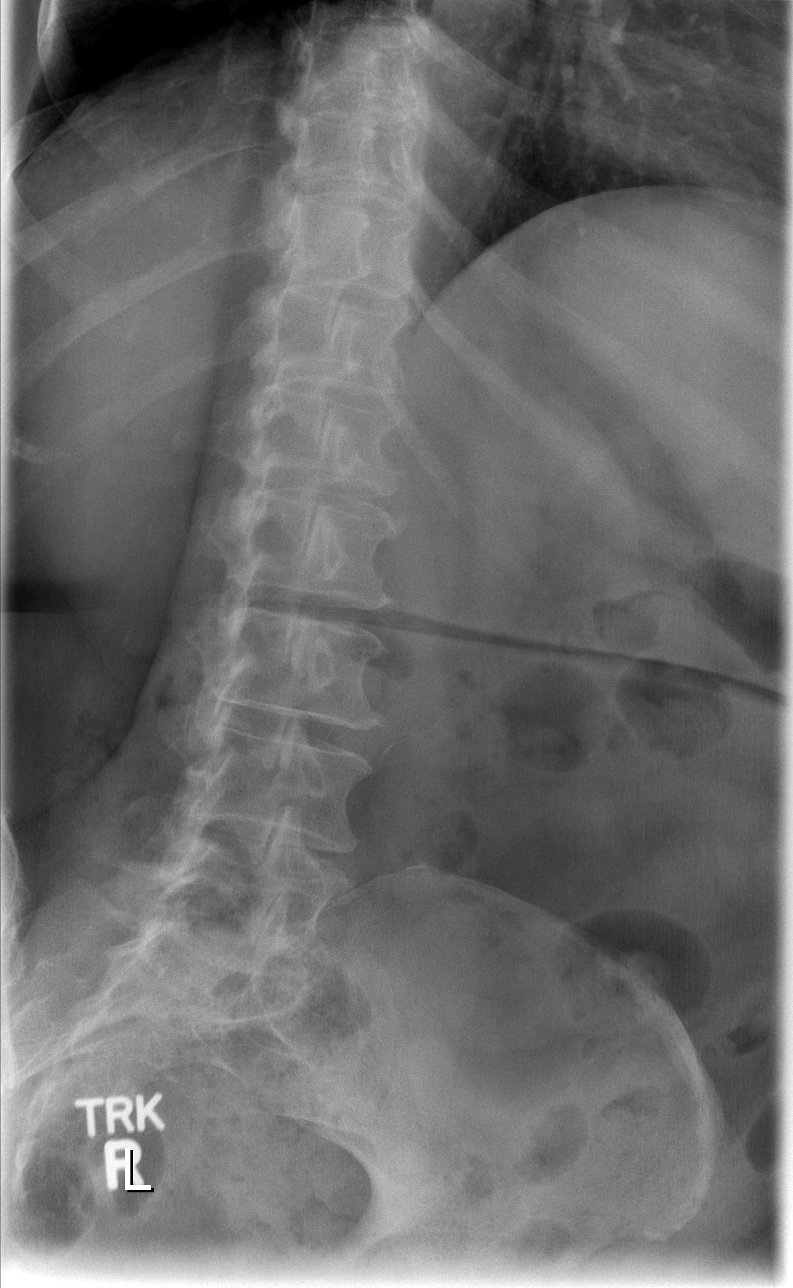

[t l-spine lat]
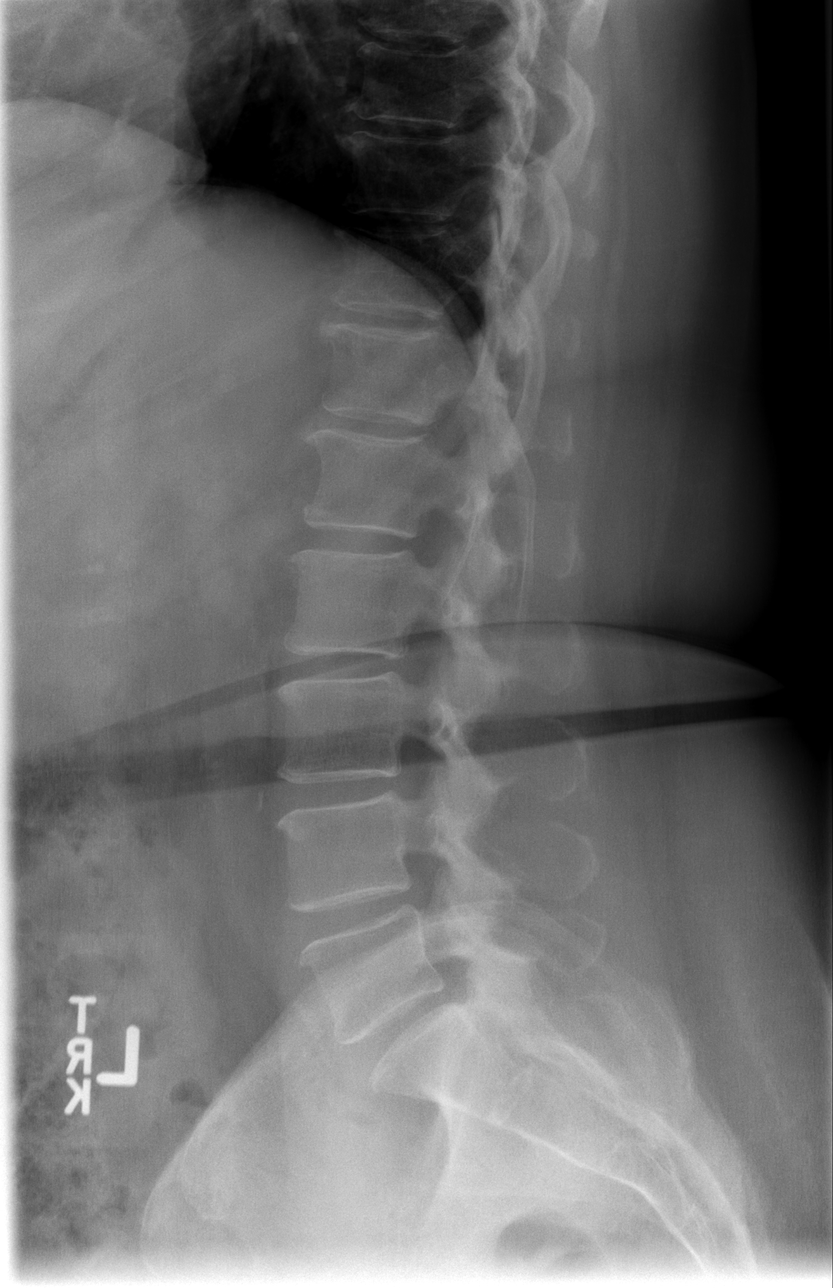

[t l-spine l5-s1 spot]
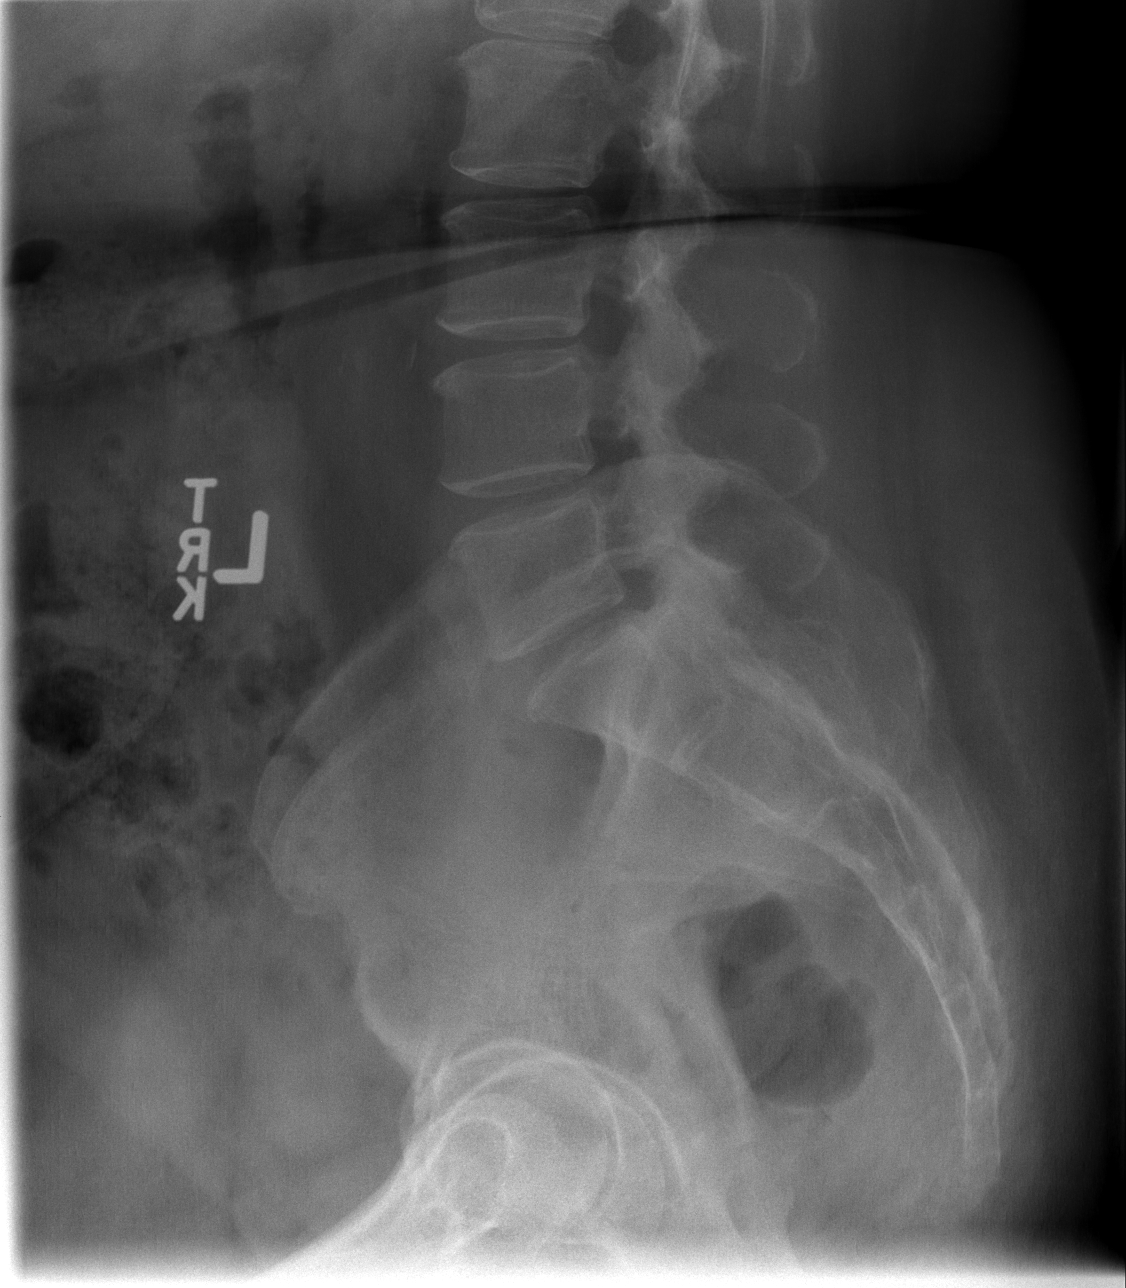

[5 of 5 positions shown; findings below may reference images not displayed]

FINDINGS: Mild osseous demineralization.
Five non-rib bearing lumbar vertebrae.
Vertebral body and disc space heights maintained.
No acute fracture, subluxation or bone destruction.
Very tiny superior endplate spurs throughout lumbar region.
No spondylolysis.
SI joints symmetric.
IMPRESSION: No acute lumbar spine abnormalities.
Minimal scattered endplate spur formation.
If the patient has radicular symptoms consider MR imaging without
contrast for further evaluation.

## 2016-03-02 ENCOUNTER — Other Ambulatory Visit: Payer: Self-pay | Admitting: Pulmonary Disease

## 2016-03-02 DIAGNOSIS — Z1231 Encounter for screening mammogram for malignant neoplasm of breast: Secondary | ICD-10-CM

## 2016-03-13 ENCOUNTER — Ambulatory Visit: Payer: Medicare HMO

## 2017-05-07 ENCOUNTER — Other Ambulatory Visit (HOSPITAL_COMMUNITY): Payer: Self-pay | Admitting: Pulmonary Disease

## 2017-05-07 DIAGNOSIS — N390 Urinary tract infection, site not specified: Secondary | ICD-10-CM

## 2017-05-07 DIAGNOSIS — R350 Frequency of micturition: Secondary | ICD-10-CM

## 2017-05-14 ENCOUNTER — Encounter (HOSPITAL_COMMUNITY): Payer: Self-pay

## 2017-05-14 ENCOUNTER — Ambulatory Visit (HOSPITAL_COMMUNITY)
Admission: RE | Admit: 2017-05-14 | Discharge: 2017-05-14 | Disposition: A | Discharge status: Home / Self Care | Payer: Medicare HMO | Source: Ambulatory Visit | Attending: Pulmonary Disease | Admitting: Pulmonary Disease

## 2017-05-14 DIAGNOSIS — N390 Urinary tract infection, site not specified: Secondary | ICD-10-CM | POA: Diagnosis present

## 2017-05-14 DIAGNOSIS — R350 Frequency of micturition: Secondary | ICD-10-CM | POA: Insufficient documentation

## 2018-01-15 IMAGING — US US RENAL
1 series · 14 of 25 positions shown · non-contrast
Comparison: None.

CLINICAL DATA: 71-year-old female with urinary frequency. UTI.
Hypertension. Initial encounter.

EXAM:
RENAL / URINARY TRACT ULTRASOUND COMPLETE

[Series 1: us renal · 0.23mm/px · 14 of 36 slices shown]
[im 1/36]
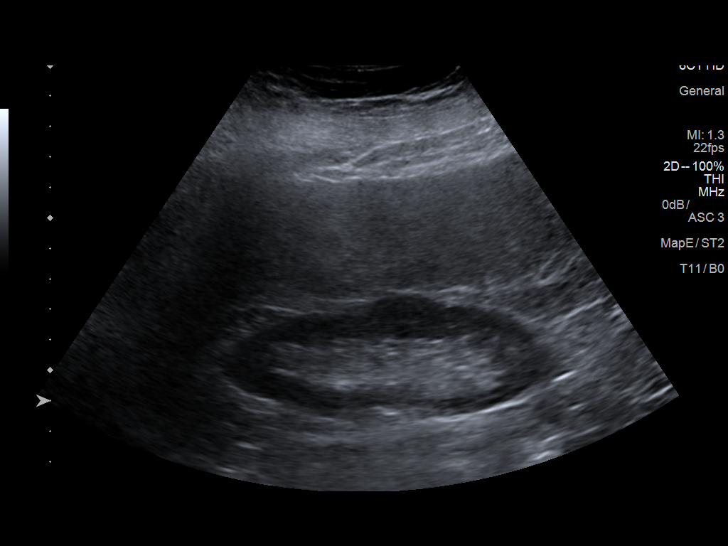
[im 3/36]
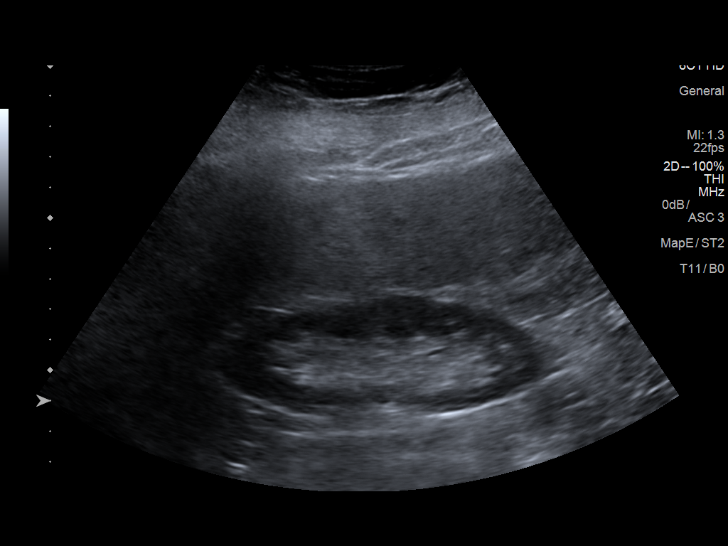
[im 6/36]
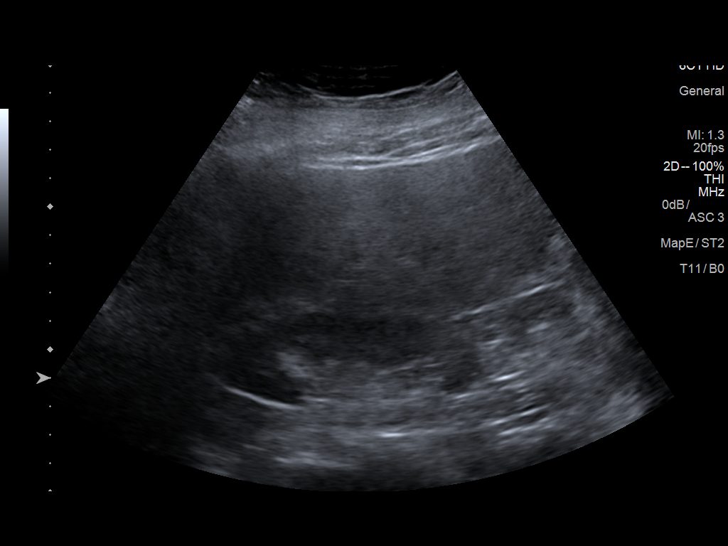
[im 9/36]
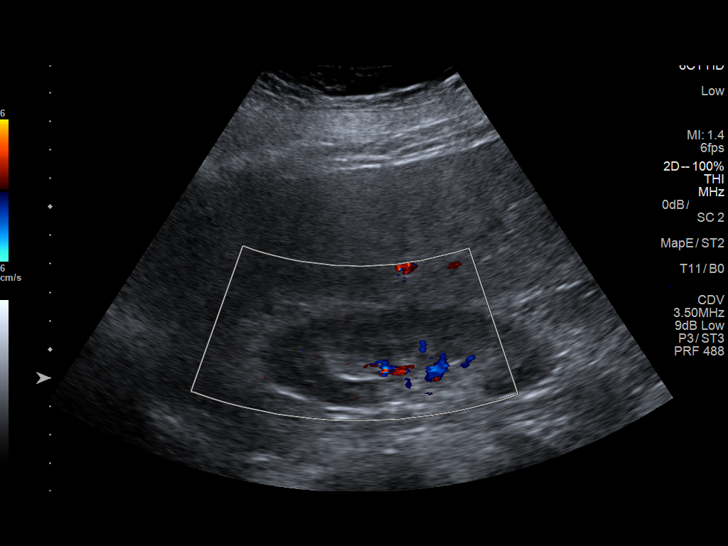
[im 12/36]
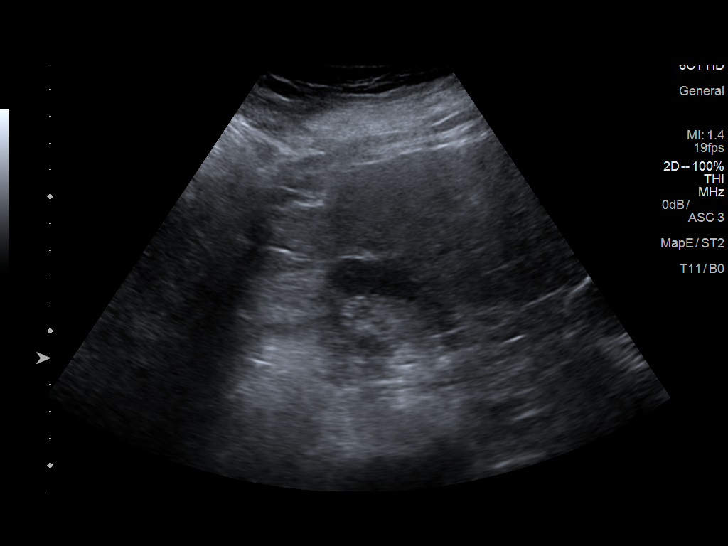
[im 14/36]
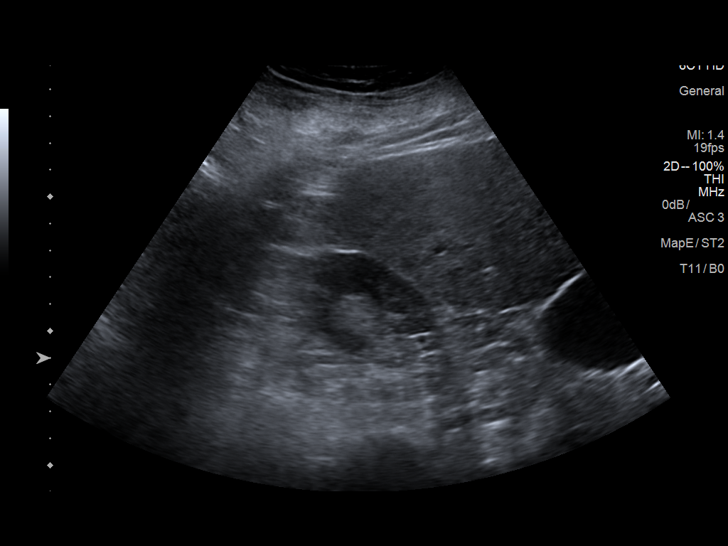
[im 17/36]
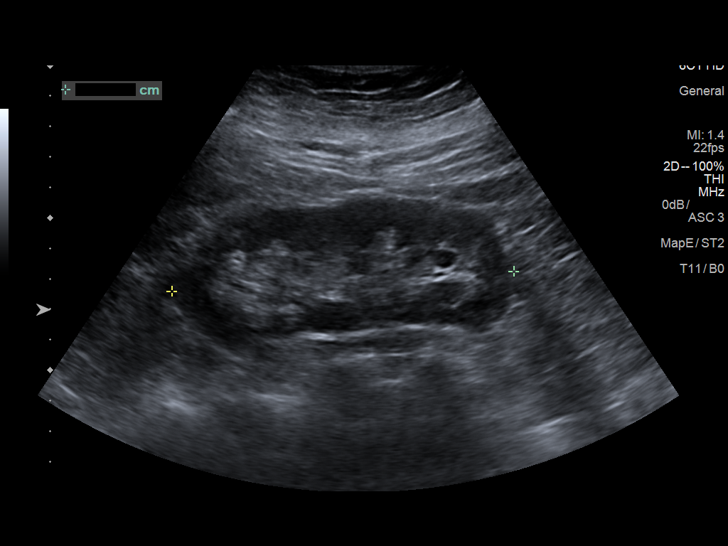
[im 19/36]
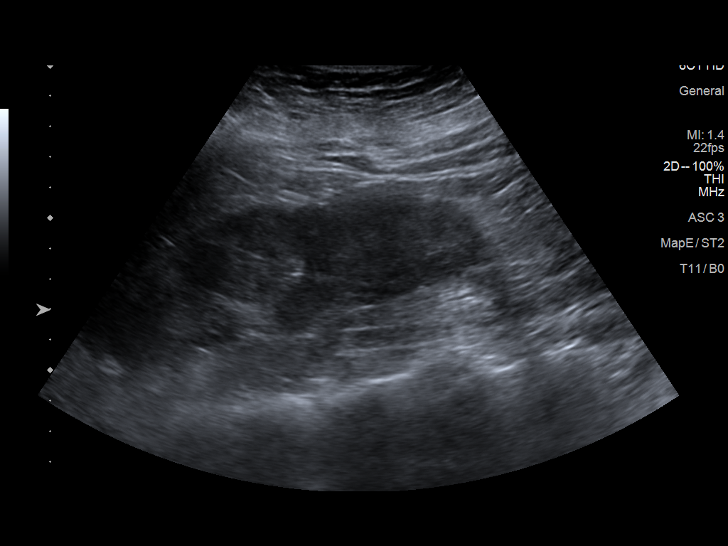
[im 22/36]
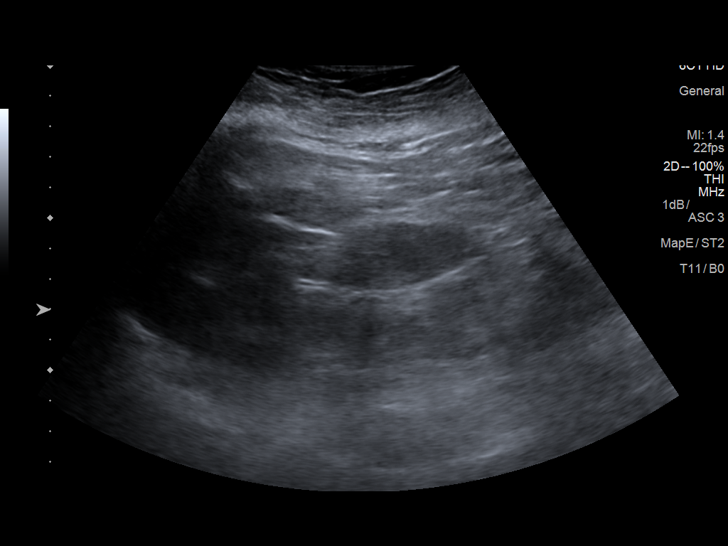
[im 24/36]
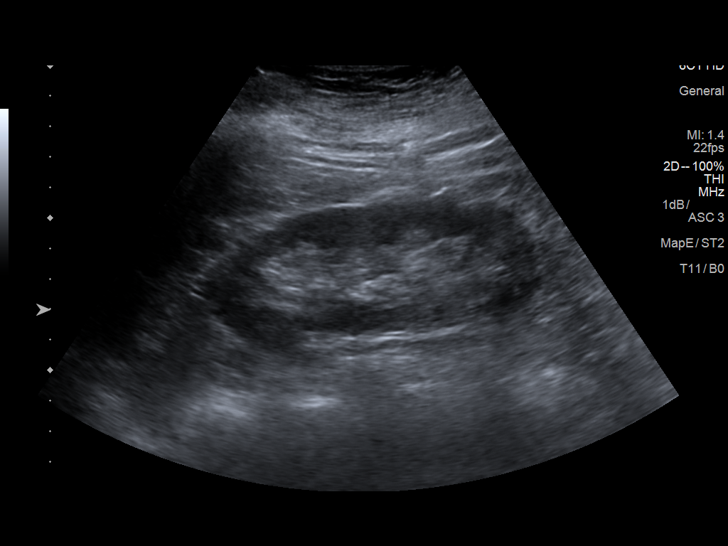
[im 27/36]
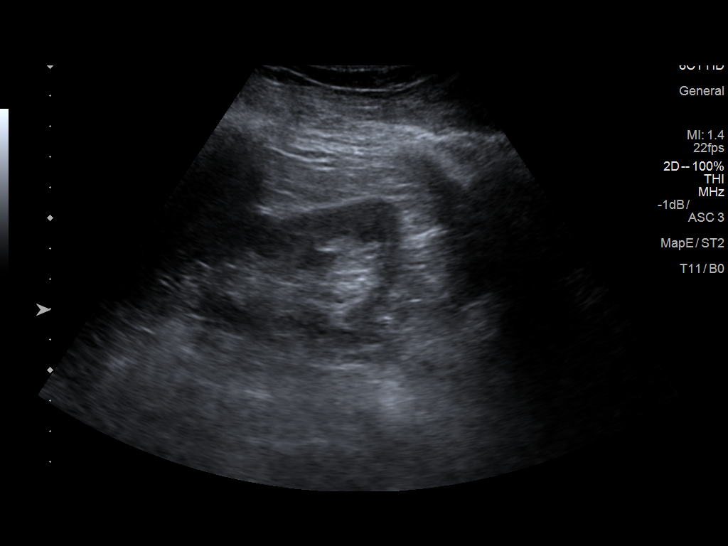
[im 30/36]
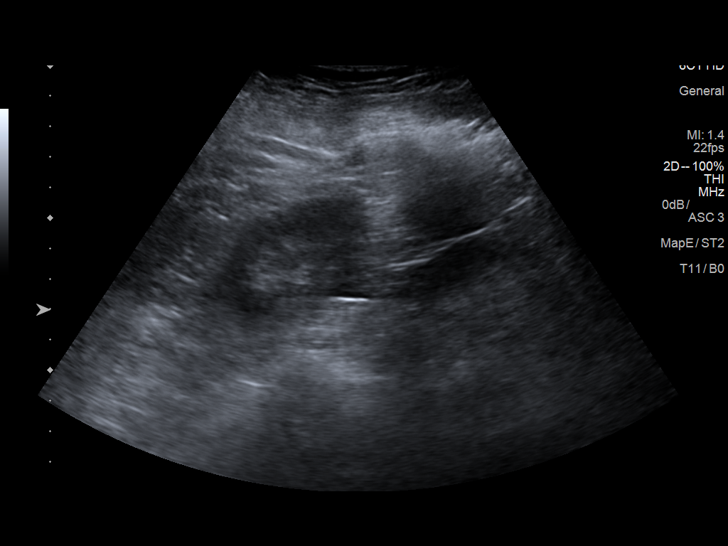
[im 33/36]
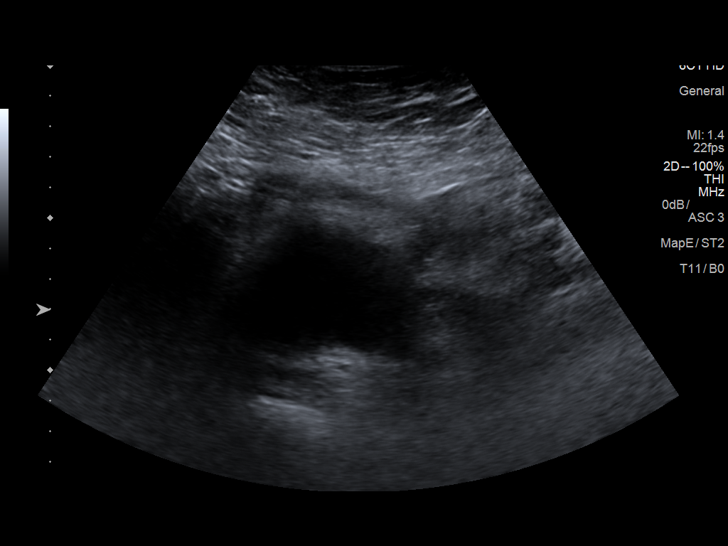
[im 36/36]
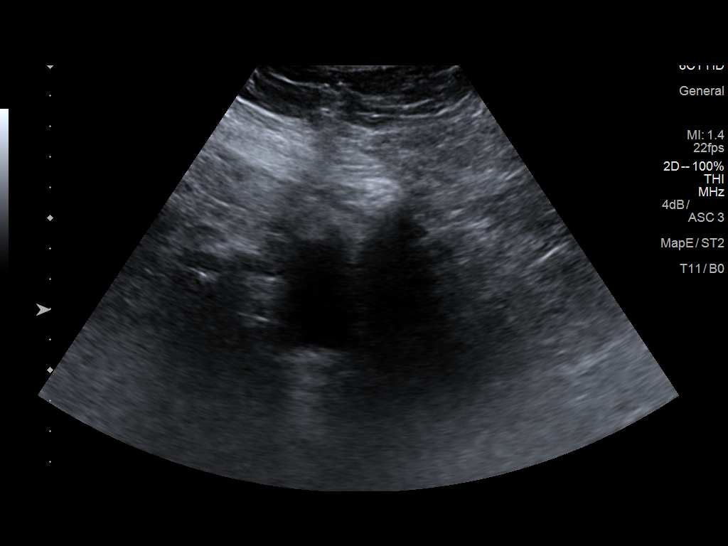

[14 of 25 positions shown; findings below may reference images not displayed]

FINDINGS: Right Kidney:

Length: 11.2 cm. Echogenicity within normal limits. No mass or
hydronephrosis visualized.

Left Kidney:

Length: 11.2 cm. Echogenicity within normal limits. No mass or
hydronephrosis visualized.

Bladder:

Decompressed and evaluation limited
IMPRESSION: Kidneys unremarkable.

Decompressed urinary bladder with limited evaluation.

## 2018-03-04 ENCOUNTER — Encounter (HOSPITAL_COMMUNITY): Payer: Self-pay

## 2018-03-04 ENCOUNTER — Emergency Department (HOSPITAL_COMMUNITY)
Admission: EM | Admit: 2018-03-04 | Discharge: 2018-03-04 | Disposition: A | Discharge status: Home / Self Care | Payer: Medicare HMO | Attending: Emergency Medicine | Admitting: Emergency Medicine

## 2018-03-04 ENCOUNTER — Encounter: Payer: Self-pay | Admitting: Physician Assistant

## 2018-03-04 ENCOUNTER — Emergency Department (HOSPITAL_COMMUNITY): Payer: Medicare HMO

## 2018-03-04 ENCOUNTER — Other Ambulatory Visit: Payer: Self-pay

## 2018-03-04 DIAGNOSIS — J441 Chronic obstructive pulmonary disease with (acute) exacerbation: Secondary | ICD-10-CM

## 2018-03-04 DIAGNOSIS — Z87891 Personal history of nicotine dependence: Secondary | ICD-10-CM | POA: Diagnosis not present

## 2018-03-04 DIAGNOSIS — Z79899 Other long term (current) drug therapy: Secondary | ICD-10-CM | POA: Insufficient documentation

## 2018-03-04 DIAGNOSIS — R0602 Shortness of breath: Secondary | ICD-10-CM | POA: Insufficient documentation

## 2018-03-04 DIAGNOSIS — R531 Weakness: Secondary | ICD-10-CM | POA: Diagnosis not present

## 2018-03-04 DIAGNOSIS — I10 Essential (primary) hypertension: Secondary | ICD-10-CM | POA: Insufficient documentation

## 2018-03-04 DIAGNOSIS — R079 Chest pain, unspecified: Secondary | ICD-10-CM | POA: Diagnosis present

## 2018-03-04 HISTORY — DX: Chronic obstructive pulmonary disease, unspecified: J44.9

## 2018-03-04 LAB — BASIC METABOLIC PANEL
Anion gap: 10 (ref 5–15)
BUN: 12 mg/dL (ref 8–23)
CHLORIDE: 103 mmol/L (ref 98–111)
CO2: 25 mmol/L (ref 22–32)
Calcium: 9.6 mg/dL (ref 8.9–10.3)
Creatinine, Ser: 1.02 mg/dL — ABNORMAL HIGH (ref 0.44–1.00)
GFR calc Af Amer: >60 mL/min (ref >60)
GFR calc non Af Amer: 54 mL/min — ABNORMAL LOW (ref >60)
Glucose, Bld: 135 mg/dL — ABNORMAL HIGH (ref 70–99)
POTASSIUM: 4 mmol/L (ref 3.5–5.1)
SODIUM: 138 mmol/L (ref 135–145)

## 2018-03-04 LAB — URINALYSIS, ROUTINE W REFLEX MICROSCOPIC
Bilirubin Urine: NEGATIVE
Glucose, UA: NEGATIVE mg/dL
HGB URINE DIPSTICK: NEGATIVE
Ketones, ur: NEGATIVE mg/dL
Leukocytes, UA: NEGATIVE
NITRITE: NEGATIVE
Protein, ur: NEGATIVE mg/dL
SPECIFIC GRAVITY, URINE: 1.016 (ref 1.005–1.030)
pH: 5 (ref 5.0–8.0)

## 2018-03-04 LAB — CBC
HEMATOCRIT: 41.1 % (ref 36.0–46.0)
Hemoglobin: 13.8 g/dL (ref 12.0–15.0)
MCH: 30.5 pg (ref 26.0–34.0)
MCHC: 33.6 g/dL (ref 30.0–36.0)
MCV: 90.7 fL (ref 78.0–100.0)
Platelets: 122 10*3/uL — ABNORMAL LOW (ref 150–400)
RBC: 4.53 MIL/uL (ref 3.87–5.11)
RDW: 13.2 % (ref 11.5–15.5)
WBC: 3.5 10*3/uL — ABNORMAL LOW (ref 4.0–10.5)

## 2018-03-04 LAB — I-STAT TROPONIN, ED
Troponin i, poc: 0 ng/mL (ref 0.00–0.08)
Troponin i, poc: 0 ng/mL (ref 0.00–0.08)

## 2018-03-04 MED ORDER — PREDNISONE 10 MG PO TABS: 50.0000 mg | ORAL_TABLET | Freq: Every day | ORAL | 0 refills | Status: DC

## 2018-03-04 MED ORDER — CEPHALEXIN 500 MG PO CAPS: 500.0000 mg | ORAL_CAPSULE | Freq: Three times a day (TID) | ORAL | 0 refills | Status: DC

## 2018-03-04 MED ORDER — AMOXICILLIN-POT CLAVULANATE 500-125 MG PO TABS: 1.0000 | ORAL_TABLET | Freq: Three times a day (TID) | ORAL | 0 refills | Status: DC

## 2018-03-04 NOTE — ED Provider Notes (Signed)
[facility] DEPT Provider Note   CSN: [id number] Arrival date & time: 03/04/18  1415     History   Chief Complaint Chief Complaint  Patient presents with  . Chest Pain    HPI Donna Duran is a 72 y.o. female.  HPI 71 year old female comes in with chief complaint of chest pain. Patient has history of COPD, hypertension, hyperlipidemia.  She reports that over the past several days she has noted increasing weakness and worsening shortness of breath, especially at nighttime when she has severe cough spells.  Patient reports that the cough is producing clear or white phlegm.  There is subjective fevers, patient denies any hemoptysis and the pain is typically not worse with deep inspiration or cough.  Patient has doubled up her nebulizer treatment over the past few days. Pt has no hx of PE, DVT and denies any exogenous hormone (testosterone / estrogen) use, long distance travels or surgery in the past 6 weeks, active cancer, recent immobilization.   Past Medical History:  Diagnosis Date  . Chest pain    nuclear study 02-2010 that was negative for ischemia, EF 70%  . COPD (chronic obstructive pulmonary disease) ([place])   . HTN (hypertension)   . Hyperlipemia   . SOB (shortness of breath)    copd    Patient Active Problem List   Diagnosis Date Noted  . DOE (dyspnea on exertion) 02/12/2014  . Morbid obesity ([place]) 02/12/2014  . HTN (hypertension) 01/01/2013  . Hyperlipidemia 01/01/2013  . Varicose veins of lower extremities with other complications 01/60/1093    Past Surgical History:  Procedure Laterality Date  . ABDOMINAL HYSTERECTOMY       OB History   None      Home Medications    Prior to Admission medications   Medication Sig Start Date End Date Taking? Authorizing Provider  Cholecalciferol (VITAMIN D) 2000 UNITS tablet Take 2,000 Units by mouth daily.   Yes [provider]  FLUoxetine (PROZAC) 20 MG capsule Take 40 mg by  mouth daily. Takes 2 tablets daily   Yes [provider]  fluticasone furoate-vilanterol (BREO ELLIPTA) 100-25 MCG/INH AEPB Inhale 1 puff into the lungs daily as needed (sob and wheezing).   Yes [provider]  furosemide (LASIX) 40 MG tablet TK 1 T PO QD FOR LEG PRN SWELLING AND SOB 12/19/17  Yes [provider]  ipratropium-albuterol (DUONEB) 0.5-2.5 (3) MG/3ML SOLN INHALE 3 ML BID PRN FOR SOB AND WHEEZING 01/06/18  Yes [provider]  traMADol (ULTRAM) 50 MG tablet Take 50 mg by mouth as needed for pain.   Yes [provider]  zolpidem (AMBIEN) 10 MG tablet Take 10 mg by mouth at bedtime.    Yes [provider]  amoxicillin-clavulanate (AUGMENTIN) 500-125 MG tablet Take 1 tablet (500 mg total) by mouth every 8 (eight) hours. 03/04/18   Varney Biles, MD  cephALEXin (KEFLEX) 500 MG capsule Take 1 capsule (500 mg total) by mouth 3 (three) times daily. 03/04/18   Varney Biles, MD  predniSONE (DELTASONE) 10 MG tablet Take 5 tablets (50 mg total) by mouth daily. 03/04/18   Varney Biles, MD    Family History Family History  Problem Relation Age of Onset  . Heart failure Mother   . Cancer Paternal Grandfather   . Hypertension Brother   . COPD Sister     Social History Social History   Tobacco Use  . Smoking status: Former Smoker    Types: Cigarettes  Last attempt to quit: 07/30/1982    Years since quitting: 35.6  . Smokeless tobacco: Never Used  Substance Use Topics  . Alcohol use: Yes    Comment: occasionally  . Drug use: Never     Allergies   Morphine and related   Review of Systems Review of Systems  Constitutional: Positive for activity change.  Respiratory: Positive for cough and shortness of breath.   Cardiovascular: Negative for chest pain.  Musculoskeletal: Positive for back pain.  All other systems reviewed and are negative.    Physical Exam Updated Vital Signs BP 122/73 (BP Location: Right Arm)   Pulse  66   Temp 98.6 F (37 C) (Oral)   Resp 19   Ht 5\' 2"  (1.575 m)   Wt 93.9 kg (207 lb)   SpO2 96%   BMI 37.86 kg/m   Physical Exam  Constitutional: She is oriented to person, place, and time. She appears well-developed.  HENT:  Head: Normocephalic and atraumatic.  Eyes: EOM are normal.  Neck: Normal range of motion. Neck supple.  Cardiovascular: Normal rate and normal pulses.  Pulmonary/Chest: Effort normal.  Abdominal: Bowel sounds are normal.  Musculoskeletal:       Right lower leg: She exhibits no tenderness and no edema.       Left lower leg: She exhibits no tenderness and no edema.  Neurological: She is alert and oriented to person, place, and time.  Skin: Skin is warm and dry.  Nursing note and vitals reviewed.    ED Treatments / Results  Labs (all labs ordered are listed, but only abnormal results are displayed) Labs Reviewed  BASIC METABOLIC PANEL - Abnormal; Notable for the following components:      Result Value   Glucose, Bld 135 (*)    Creatinine, Ser 1.02 (*)    GFR calc non Af Amer 54 (*)    All other components within normal limits  CBC - Abnormal; Notable for the following components:   WBC 3.5 (*)    Platelets 122 (*)    All other components within normal limits  URINALYSIS, ROUTINE W REFLEX MICROSCOPIC - Abnormal; Notable for the following components:   Color, Urine AMBER (*)    All other components within normal limits  I-STAT TROPONIN, ED  I-STAT TROPONIN, ED    EKG EKG Interpretation  Date/Time:  Tuesday March 04 2018 14:28:03 EDT Ventricular Rate:  97 PR Interval:    QRS Duration: 89 QT Interval:  386 QTC Calculation: 491 R Axis:   -20 Text Interpretation:  Sinus rhythm Borderline left axis deviation Nonspecific T abnormalities, lateral leads Borderline prolonged QT interval No acute changes No old tracing to compare Confirmed by Varney Biles [phone]) on 03/04/2018 3:22:25 PM     Radiology Dg Chest 2 View  Result Date:  03/04/2018 CLINICAL DATA:  72 year old with productive cough for 3 days. EXAM: CHEST - 2 VIEW COMPARISON:  03/06/2011 FINDINGS: Evidence for hyperinflation. Cardiac silhouette is enlarged. Prominent lung markings along the medial right lung base appear chronic. No pleural effusions. Bony thorax is intact. IMPRESSION: Hyperinflation without focal lung disease. Mild cardiomegaly. Electronically Signed   By: Markus Daft M.D.   On: 03/04/2018 14:55    Procedures Procedures (including critical care time)  Medications Ordered in ED Medications - No data to display   Initial Impression / Assessment and Plan / ED Course  I have reviewed the triage vital signs and the nursing notes.  Pertinent labs & imaging results that were  available during my care of the patient were reviewed by me and considered in my medical decision making (see chart for details).     72 year old female comes in with chief complaint of shortness of breath and cough. Patient has history of COPD, hypertension and hyperlipidemia. Patient reported over the past several days she has been having worsening of her shortness of breath at nighttime along with associated cough.  Patient denies any paroxysmal nocturnal dyspnea however.  She also denies any exertional chest pain or pleuritic chest pain.  In the ED, patient's O2 sats appears to be at baseline normal.  She has no respiratory distress and her vital signs are all within normal limits. Patient is brought in with her an EKG that was done at the urgent care yesterday, and it shows clear SVT.  I think it is possible that patient is having mild COPD exacerbation, and intermittent episodes of SVT which is leading her to be short of breath.  PE considered in the differential, but deemed to be less likely, especially in the setting of this SVT that we noted. EKG here is all within normal limits, and patient's cardiac monitoring has been fine as well.  Patient also reports some urinary  discomfort.  She was recently treated with Bactrim.  She does not have dysuria or urinary frequency.  UA ordered and has no markers consistent with infection.  We will give patient Augmentin along with prednisone for her COPD exacerbation, which should help potentially with her UTI.  She has been advised to take Keflex if she starts developing new UTI-like symptoms.  We will have patient follow-up with cardiology.  If this exertional dyspnea is deemed to be different than COPD then she might need an echocardiogram or stress test.  Additionally, patient might need Holter monitoring and possibly better management of AVNRT.  Strict ER return precautions have been discussed, and patient is agreeing with the plan and is comfortable with the workup done and the recommendations from the ER.   Final Clinical Impressions(s) / ED Diagnoses   Final diagnoses:  COPD with acute exacerbation ([place])  Generalized weakness    ED Discharge Orders        Ordered    amoxicillin-clavulanate (AUGMENTIN) 500-125 MG tablet  Every 8 hours     03/04/18 2005    predniSONE (DELTASONE) 10 MG tablet  Daily     03/04/18 2005    cephALEXin (KEFLEX) 500 MG capsule  3 times daily     03/04/18 2006       Varney Biles, MD 03/04/18 [phone]

## 2018-03-04 NOTE — Progress Notes (Addendum)
For documentation purposes, sent following msg to scheduling dept via staff msgs:  I am covering the card master role today Wannetta Sender).  I got a page from Dr. Kathrynn Humble requesting that we see this patient in early post ER follow-up for AVNRT.  She is back in normal rhythm.  She remotely saw Dr. Debara Pickett.  The patient will be going home from the emergency room.  Can you please call her with an appointment? Thanks, [employer]

## 2018-03-04 NOTE — ED Notes (Signed)
Pt ambulated well. O2 remained at 95%

## 2018-03-04 NOTE — ED Triage Notes (Signed)
Patient c/o intermittent  mid and left chest pain x 2-3 weeks. Patient reports that she has a history of COPD. Patient c/o diaphoresis when chest pain occurs.

## 2018-03-04 NOTE — ED Notes (Signed)
Pt made aware urine collection is needed

## 2018-03-04 NOTE — Discharge Instructions (Signed)
We suspect that you are having COPD exacerbation that is mild, along with questionable urinary tract infection.  Start taking the steroids and the antibiotics as prescribed.  Take Keflex only if you start having worsening in your urinary symptoms or develop new burning with urination, blood in the urine or frequent urination. See your doctor in 1 week.  Please return to the ER if your symptoms worsen; you have increased pain, fevers, chills, inability to keep any medications down, confusion. Otherwise see the outpatient doctor as requested.

## 2018-03-05 ENCOUNTER — Telehealth: Payer: Self-pay | Admitting: Internal Medicine

## 2018-03-05 NOTE — Telephone Encounter (Signed)
New Message        Patient was told that she would get a call from this office to schedule an appt. Patient was in hosp for coughing wheezing, SOB,need an appt. ASAP.Per ER Cardiotest.

## 2018-03-07 ENCOUNTER — Other Ambulatory Visit: Payer: Self-pay | Admitting: Internal Medicine

## 2018-03-07 ENCOUNTER — Encounter: Payer: Self-pay | Admitting: Internal Medicine

## 2018-03-07 ENCOUNTER — Ambulatory Visit: Payer: Medicare HMO | Admitting: Internal Medicine

## 2018-03-07 VITALS — BP 136/78 | HR 60 | Ht 62.0 in | Wt 207.8 lb

## 2018-03-07 DIAGNOSIS — R0602 Shortness of breath: Secondary | ICD-10-CM | POA: Diagnosis not present

## 2018-03-07 DIAGNOSIS — R0681 Apnea, not elsewhere classified: Secondary | ICD-10-CM

## 2018-03-07 DIAGNOSIS — I471 Supraventricular tachycardia: Secondary | ICD-10-CM

## 2018-03-07 DIAGNOSIS — E669 Obesity, unspecified: Secondary | ICD-10-CM

## 2018-03-07 DIAGNOSIS — R0683 Snoring: Secondary | ICD-10-CM

## 2018-03-07 MED ORDER — DILTIAZEM HCL 30 MG PO TABS: ORAL_TABLET | ORAL | 2 refills | Status: DC

## 2018-03-07 NOTE — Progress Notes (Signed)
OFFICE NOTE  Chief Complaint:  Followup of SVT  Primary Care Physician: Vincente Liberty, MD  HPI:  Donna Duran  Is a 72 year old female who was previously followed by Dr. Rex Kras for a history of hypertension and dyslipidemia. She also has morbid obesity and bilateral osteoarthritis and a left Baker's cyst. She has been seen by Dr. Durward Fortes for her knees and has been injected but is not thought to need knee replacement at this time. She continues to have leg pain bilaterally and feels that it may be a circulatory problem and is here to investigate that today. She did have stigmata of chronic venous insufficiency, and I recommended venous Dopplers. Venous Dopplers demonstrated a small area of venous reflux in the left greater saphenous vein between the calf and just to above the knee. This was a small vessel, measuring just over 3 mm. However the greater saphenous vein is up to 6-7 mm in size in the mid thigh.  We discussed her symptoms which include left leg pain, it is difficult to know if this is related to the small Baker cyst which was also noted during ultrasound, measuring about 1 x 3 cm. There was no evidence of cyst rupture.    [name] returns today for followup. She reports a short while ago she had an episode of a significant GI upset. She was sick for about a week and then presented to her primary care doctor. They did an EKG and told her that her "heart was strained". She denied any chest pain he has had no further episodes since then. She does get short of breath but this is stable for her and not worsening. She reports her cholesterol recently checked her total cholesterol was just over 100 which represents very good control.  03/07/2018  [name] was seen today in hospital follow-up.  I had previously seen her in 2014, and she is considered a new patient.  She had experienced an episode of tachypalpitations and was found to be in SVT.  This was addressed by EMS and she  presented to the ER which time she was spontaneously converted back to sinus.  Since then she denies any recurrent episodes.  She was not on any AV nodal blocking medications.  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  She was not placed on additional daily medications for SVT due to low blood pressure.  In addition she reports some chronic fatigue sometimes, including poor sleep, witnessed apnea, loud snoring and is moderately obese with a BMI of 38 and neck thickness greater than 16 inches.  Based on these findings, I am concerned that there may be underlying sleep apnea which could be a trigger for arrhythmia.  She denies chest pain and reports stable shortness of breath which is attributed to COPD.                                                                     PMHx:  Past Medical History:  Diagnosis Date  . Chest pain    nuclear study 02-2010 that was negative for ischemia, EF 70%  . COPD (chronic obstructive pulmonary disease) ([place])   . HTN (hypertension)   . Hyperlipemia   . SOB (shortness of breath)  copd    Past Surgical History:  Procedure Laterality Date  . ABDOMINAL HYSTERECTOMY      FAMHx:  Family History  Problem Relation Age of Onset  . Heart failure Mother   . Cancer Paternal Grandfather   . Hypertension Brother   . COPD Sister     SOCHx:   reports that she quit smoking about 35 years ago. Her smoking use included cigarettes. She has never used smokeless tobacco. She reports that she drinks alcohol. She reports that she does not use drugs.  ALLERGIES:  Allergies  Allergen Reactions  . Morphine And Related Nausea And Vomiting    ROS: A comprehensive review of systems was negative except for: Respiratory: positive for dyspnea on exertion  HOME MEDS: Current Outpatient Medications  Medication Sig Dispense Refill  . amoxicillin-clavulanate (AUGMENTIN) 500-125 MG tablet Take 1 tablet (500 mg total) by mouth every 8 (eight) hours. 21 tablet 0  . Cholecalciferol (VITAMIN  D) 2000 UNITS tablet Take 2,000 Units by mouth daily.    Marland Kitchen FLUoxetine (PROZAC) 20 MG capsule Take 40 mg by mouth daily. Takes 2 tablets daily    . fluticasone furoate-vilanterol (BREO ELLIPTA) 100-25 MCG/INH AEPB Inhale 1 puff into the lungs daily as needed (sob and wheezing).    . furosemide (LASIX) 40 MG tablet TK 1 T PO QD FOR LEG PRN SWELLING AND SOB  4  . ipratropium-albuterol (DUONEB) 0.5-2.5 (3) MG/3ML SOLN INHALE 3 ML BID PRN FOR SOB AND WHEEZING  4  . predniSONE (DELTASONE) 10 MG tablet Take 5 tablets (50 mg total) by mouth daily. 25 tablet 0  . traMADol (ULTRAM) 50 MG tablet Take 50 mg by mouth as needed for pain.    Marland Kitchen zolpidem (AMBIEN) 10 MG tablet Take 10 mg by mouth at bedtime.     . cephALEXin (KEFLEX) 500 MG capsule Take 1 capsule (500 mg total) by mouth 3 (three) times daily. (Patient not taking: Reported on 03/07/2018) 21 capsule 0   No current facility-administered medications for this visit.     LABS/IMAGING: No results found for this or any previous visit (from the past 48 hour(s)). No results found.  VITALS: BP 136/78   Pulse 60   Ht 5\' 2"  (1.575 m)   Wt 207 lb 12.8 oz (94.3 kg)   BMI 38.01 kg/m   EXAM: General appearance: alert and no distress Neck: no carotid bruit, no JVD and thyroid not enlarged, symmetric, no tenderness/mass/nodules Lungs: clear to auscultation bilaterally Heart: regular rate and rhythm, S1, S2 normal, no murmur, click, rub or gallop Abdomen: soft, non-tender; bowel sounds normal; no masses,  no organomegaly Extremities: extremities normal, atraumatic, no cyanosis or edema Pulses: 2+ and symmetric Skin: Skin color, texture, turgor normal. 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 HTN 3. Dyslipidemia 4. Stable DOE 5. CEAP 1,3 venous insufficiency, greater in the left leg 6. Morbid obesity  PLAN: 1.   Ms. Cansler scented recently with an SVT, likely AVNRT.  She has normal to low blood pressure and was mildly symptomatic.  She also reports snoring, and family is concerned about witnessed apnea and hypopnea as well as daytime fatigue, given her obesity and neck thickness which is concerning  for possible obstructive sleep apnea. Her EPWSS score is 9.  I like to refer her for a split-night study.  We will provide diltiazem short acting to use for episode of sustained tachycardia as needed.  If she has recurrent symptoms she may need daily treatment although will have to be cognizant of blood pressure which she says can get low.  Alternatively, we may refer her for an electrophysiology study.  Also obtain an echocardiogram to make sure there are no need to get new structural changes to the heart and might explain her symptoms.  Follow-up with me afterwards.  Pixie Casino, MD, [facility], [place] Director of the [facility]  Card[facility] Diplomate of the American Board of Clinical Lipidology Attending Cardiologist  Direct Dial: [phone]  Fax: [phone]  Website:  [email or web address] Nikeria Kalman 03/07/2018, 10:23 AM

## 2018-03-07 NOTE — Patient Instructions (Signed)
Medication Instructions:   Dr. Debara Pickett has prescribed diltiazem 30mg  to use as needed for sustained heart rate greater than 150. You can use this MAX twice daily  Labwork:  NONE  Testing/Procedures:  Your physician has requested that you have an echocardiogram. Echocardiography is a painless test that uses sound waves to create images of your heart. It provides your doctor with information about the size and shape of your heart and how well your heart's chambers and valves are working. This procedure takes approximately one hour. There are no restrictions for this procedure. -- this is done at [address]  Your physician has recommended that you have a sleep study. This test records several body functions during sleep, including: brain activity, eye movement, oxygen and carbon dioxide blood levels, heart rate and rhythm, breathing rate and rhythm, the flow of air through your mouth and nose, snoring, body muscle movements, and chest and belly movement. -- this will be pre-certified with insurance and then you will be contacted to schedule -- this is done at [facility]:  Your physician recommends that you schedule a follow-up appointment after echo testing has been completed   If you need a refill on your cardiac medications before your next appointment, please call your pharmacy.  Any Other Special Instructions Will Be Listed Below (If Applicable).

## 2018-03-11 ENCOUNTER — Encounter: Payer: Self-pay | Admitting: Internal Medicine

## 2018-03-11 ENCOUNTER — Telehealth: Payer: Self-pay | Admitting: *Deleted

## 2018-03-11 DIAGNOSIS — I471 Supraventricular tachycardia: Secondary | ICD-10-CM | POA: Insufficient documentation

## 2018-03-11 NOTE — Telephone Encounter (Signed)
-----   Message from Lawana Pai sent at 03/07/2018 11:03 AM EDT ----- Regarding: sleep study Dr Debara Pickett has ordered sleep study for this patient.

## 2018-03-11 NOTE — Telephone Encounter (Signed)
Request received from [facility] for supporting documentation.  Documentation was submitted earlier this afternoon per Barry Brunner.

## 2018-03-11 NOTE — Telephone Encounter (Signed)
PA submitted to [facility] in lab split night sleep study via web portal. Clinical notes faxed to [phone].

## 2018-03-13 ENCOUNTER — Telehealth: Payer: Self-pay | Admitting: *Deleted

## 2018-03-13 NOTE — Telephone Encounter (Signed)
-----   Message from Lawana Pai sent at 03/07/2018 11:03 AM EDT ----- Regarding: sleep study Dr Debara Pickett has ordered sleep study for this patient.

## 2018-03-13 NOTE — Telephone Encounter (Signed)
Patient notified insurance approved in lab sleep study. Appointment scheduled 04/15/18.

## 2018-03-18 ENCOUNTER — Other Ambulatory Visit: Payer: Self-pay

## 2018-03-18 ENCOUNTER — Ambulatory Visit (HOSPITAL_COMMUNITY): Payer: Medicare HMO | Attending: Cardiovascular Disease

## 2018-03-18 DIAGNOSIS — I5030 Unspecified diastolic (congestive) heart failure: Secondary | ICD-10-CM | POA: Diagnosis not present

## 2018-03-18 DIAGNOSIS — Z6838 Body mass index (BMI) 38.0-38.9, adult: Secondary | ICD-10-CM | POA: Diagnosis not present

## 2018-03-18 DIAGNOSIS — R0600 Dyspnea, unspecified: Secondary | ICD-10-CM | POA: Insufficient documentation

## 2018-03-18 DIAGNOSIS — I471 Supraventricular tachycardia: Secondary | ICD-10-CM | POA: Diagnosis not present

## 2018-03-18 DIAGNOSIS — I110 Hypertensive heart disease with heart failure: Secondary | ICD-10-CM | POA: Diagnosis not present

## 2018-03-18 DIAGNOSIS — R0602 Shortness of breath: Secondary | ICD-10-CM | POA: Insufficient documentation

## 2018-03-18 DIAGNOSIS — E785 Hyperlipidemia, unspecified: Secondary | ICD-10-CM | POA: Diagnosis not present

## 2018-03-18 DIAGNOSIS — J449 Chronic obstructive pulmonary disease, unspecified: Secondary | ICD-10-CM | POA: Insufficient documentation

## 2018-03-27 ENCOUNTER — Ambulatory Visit: Payer: Medicare HMO | Admitting: Pulmonary Disease

## 2018-03-27 ENCOUNTER — Encounter: Payer: Self-pay | Admitting: Pulmonary Disease

## 2018-03-27 ENCOUNTER — Other Ambulatory Visit (INDEPENDENT_AMBULATORY_CARE_PROVIDER_SITE_OTHER): Payer: Medicare HMO

## 2018-03-27 VITALS — BP 132/76 | HR 74 | Ht 61.5 in | Wt 202.8 lb

## 2018-03-27 DIAGNOSIS — R0602 Shortness of breath: Secondary | ICD-10-CM

## 2018-03-27 LAB — CBC WITH DIFFERENTIAL/PLATELET
BASOS ABS: 0 10*3/uL (ref 0.0–0.1)
Basophils Relative: 0.4 % (ref 0.0–3.0)
EOS ABS: 0 10*3/uL (ref 0.0–0.7)
Eosinophils Relative: 0.6 % (ref 0.0–5.0)
HEMATOCRIT: 41.9 % (ref 36.0–46.0)
Hemoglobin: 14.3 g/dL (ref 12.0–15.0)
LYMPHS PCT: 3 % — AB (ref 12.0–46.0)
Lymphs Abs: 0.2 10*3/uL — ABNORMAL LOW (ref 0.7–4.0)
MCHC: 34.1 g/dL (ref 30.0–36.0)
MCV: 89 fl (ref 78.0–100.0)
MONOS PCT: 8.5 % (ref 3.0–12.0)
Monocytes Absolute: 0.6 10*3/uL (ref 0.1–1.0)
NEUTROS ABS: 6.2 10*3/uL (ref 1.4–7.7)
PLATELETS: 139 10*3/uL — AB (ref 150.0–400.0)
RBC: 4.71 Mil/uL (ref 3.87–5.11)
RDW: 13.6 % (ref 11.5–15.5)
WBC: 7.1 10*3/uL (ref 4.0–10.5)

## 2018-03-27 MED ORDER — FLUTICASONE-UMECLIDIN-VILANT 100-62.5-25 MCG/INH IN AEPB: 1.0000 | INHALATION_SPRAY | Freq: Every day | RESPIRATORY_TRACT | 0 refills | Status: AC

## 2018-03-27 MED ORDER — FLUTICASONE-UMECLIDIN-VILANT 100-62.5-25 MCG/INH IN AEPB: 1.0000 | INHALATION_SPRAY | Freq: Every day | RESPIRATORY_TRACT | 3 refills | Status: AC

## 2018-03-27 NOTE — Progress Notes (Signed)
Donna Duran    [id number]    1945/11/13  Primary Care Physician:Kilpatrick, Iona Beard, MD  Referring Physician: Vincente Liberty, MD [address] [place], [place] [address]  Chief complaint: Consult for dyspnea, COPD  HPI: 72 year old with COPD, hypertension, hyperlipidemia. Diagnosed with COPD around 2005, but no PFTs on record. She has an episode of bronchitis in July treated with antibiotics and prednisone. Since then she has recurrent exacerbations which improves with prednisone but recurred when she is off therapy. Seen in the ED on 03/04/2018 for fatigue, cough with clear mucus, wheezing.  Chest x-ray, labs, EKG were unremarkable.  She is given amoxicillin and steroid taper Went back to [facility] emergency room on 8/22 and treated with nebs, Solu-Medrol and a prednisone taper.  She is not on standing inhalers.  She occasionally uses Breo which she gets from primary care.  States that she is unable to afford her regular controller medication. She has significant anxiety and is on Xanax, Ambien and Prozac.  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  Lives in an apartment complex with marijuana smokers.  She feels that there may be mold in the apartment. Smoking history: 5-pack-year smoking history.  Quit in 1985 Travel history: No significant travel  Physical Exam: Blood pressure 132/76, pulse 74, height 5' 1.5" (1.562 m), weight 202 lb 12.8 oz (92 kg), SpO2 94 %. Gen:      No acute distress HEENT:  EOMI, sclera anicteric Neck:     No masses; no thyromegaly Lungs:    Clear to auscultation bilaterally; normal respiratory effort CV:         Regular rate and rhythm; no murmurs Abd:      + bowel sounds; soft, non-tender; no palpable masses, no distension Ext:    No edema; adequate peripheral perfusion Skin:      Warm and dry; no rash Neuro: alert and oriented x 3 Psych: normal mood and  affect  Data Reviewed: Imaging Chest x-ray 03/04/18-hyperinflation, mild cardiomegaly.  No acute process.  I have reviewed the images personally.  Assessment:  Chronic bronchitis, COPD She has a diagnosis of COPD for many years but does not have significant smoking history Schedule PFTs.  Check CBC differential and IgE. She is on prednisone taper taper which she will finish We will put her on controler medication with Trelegy but samples and coupon for patient assistance Follow-up in 1 to 2 months to reevaluate.  Plan/Recommendations: - PFTs, CBC, IgE - Trelegy inhaler  Marshell Garfinkel MD [facility] 03/27/2018, 3:58 PM  Outpatient Encounter Medications as of 03/27/2018  Medication Sig  . ALPRAZolam (XANAX) 0.25 MG tablet Take by mouth.  . Cholecalciferol (VITAMIN D) 2000 UNITS tablet Take 2,000 Units by mouth daily.  Marland Kitchen diltiazem (CARDIZEM) 30 MG tablet Take 1 tablet by mouth as needed for sustained heart rate greater than 150. Max twice daily.  . fluticasone furoate-vilanterol (BREO ELLIPTA) 100-25 MCG/INH AEPB Inhale 1 puff into the lungs daily as needed (sob and wheezing).  . furosemide (LASIX) 40 MG tablet TK 1 T PO QD FOR LEG PRN SWELLING AND SOB  . ipratropium-albuterol (DUONEB) 0.5-2.5 (3) MG/3ML SOLN INHALE 3 ML BID PRN FOR SOB AND WHEEZING  . predniSONE (DELTASONE) 10 MG tablet Take 5 tablets (50 mg total) by mouth daily.  . traMADol (ULTRAM) 50 MG tablet Take 50 mg by mouth as needed  for pain.  Marland Kitchen zolpidem (AMBIEN) 10 MG tablet Take 10 mg by mouth at bedtime.   . [DISCONTINUED] amoxicillin-clavulanate (AUGMENTIN) 500-125 MG tablet Take 1 tablet (500 mg total) by mouth every 8 (eight) hours.  . cephALEXin (KEFLEX) 500 MG capsule Take 1 capsule (500 mg total) by mouth 3 (three) times daily. (Patient not taking: Reported on 03/27/2018)  . FLUoxetine (PROZAC) 20 MG capsule Take 40 mg by mouth daily. Takes 2 tablets daily   No facility-administered encounter  medications on file as of 03/27/2018.     Allergies as of 03/27/2018 - Review Complete 03/27/2018  Allergen Reaction Noted  . Morphine and related Nausea And Vomiting 11/10/2012    Past Medical History:  Diagnosis Date  . Chest pain    nuclear study 02-2010 that was negative for ischemia, EF 70%  . COPD (chronic obstructive pulmonary disease) ([place])   . HTN (hypertension)   . Hyperlipemia   . SOB (shortness of breath)    copd    Past Surgical History:  Procedure Laterality Date  . ABDOMINAL HYSTERECTOMY      Family History  Problem Relation Age of Onset  . Heart failure Mother   . Cancer Paternal Grandfather   . Hypertension Brother   . COPD Sister     Social History   Socioeconomic History  . Marital status: Divorced    Spouse name: Not on file  . Number of children: 2  . Years of education: Not on file  . Highest education level: Not on file  Occupational History  . Not on file  Social Needs  . Financial resource strain: Not on file  . Food insecurity:    Worry: Not on file    Inability: Not on file  . Transportation needs:    Medical: Not on file    Non-medical: Not on file  Tobacco Use  . Smoking status: Former Smoker    Packs/day: 0.50    Years: 10.00    Pack years: 5.00    Types: Cigarettes    Last attempt to quit: 07/30/1982    Years since quitting: 35.6  . Smokeless tobacco: Never Used  Substance and Sexual Activity  . Alcohol use: Yes    Comment: occasionally  . Drug use: Never  . Sexual activity: Not on file  Lifestyle  . Physical activity:    Days per week: Not on file    Minutes per session: Not on file  . Stress: Not on file  Relationships  . Social connections:    Talks on phone: Not on file    Gets together: Not on file    Attends religious service: Not on file    Active member of club or organization: Not on file    Attends meetings of clubs or organizations: Not on file    Relationship status: Not on file  . Intimate partner  violence:    Fear of current or ex partner: Not on file    Emotionally abused: Not on file    Physically abused: Not on file    Forced sexual activity: Not on file  Other Topics Concern  . Not on file  Social History Narrative  . Not on file    Review of systems: Review of Systems  Constitutional: Negative for fever and chills.  HENT: Negative.   Eyes: Negative for blurred vision.  Respiratory: as per HPI  Cardiovascular: Negative for chest pain and palpitations.  Gastrointestinal: Negative for vomiting, diarrhea, blood per rectum. Genitourinary:  Negative for dysuria, urgency, frequency and hematuria.  Musculoskeletal: Negative for myalgias, back pain and joint pain.  Skin: Negative for itching and rash.  Neurological: Negative for dizziness, tremors, focal weakness, seizures and loss of consciousness.  Endo/Heme/Allergies: Negative for environmental allergies.  Psychiatric/Behavioral: Negative for depression, suicidal ideas and hallucinations.  All other systems reviewed and are negative.  CC: Vincente Liberty, MD

## 2018-03-27 NOTE — Patient Instructions (Addendum)
We will get a CBC differential, IgE today Schedule you for pulmonary function test Finish the prednisone We will give a prescription for Trelegy inhaler, samples and coupon for patient assistance Follow-up in 1 to 2 months.

## 2018-03-28 LAB — IGE: IgE (Immunoglobulin E), Serum: 176 kU/L — ABNORMAL HIGH

## 2018-04-04 ENCOUNTER — Emergency Department (HOSPITAL_COMMUNITY): Payer: Medicare HMO

## 2018-04-04 ENCOUNTER — Encounter (HOSPITAL_COMMUNITY): Payer: Self-pay | Admitting: Nurse Practitioner

## 2018-04-04 ENCOUNTER — Emergency Department (HOSPITAL_COMMUNITY)
Admission: EM | Admit: 2018-04-04 | Discharge: 2018-04-05 | Disposition: A | Discharge status: Home / Self Care | Payer: Medicare HMO | Attending: Emergency Medicine | Admitting: Emergency Medicine

## 2018-04-04 DIAGNOSIS — Z79899 Other long term (current) drug therapy: Secondary | ICD-10-CM | POA: Insufficient documentation

## 2018-04-04 DIAGNOSIS — I10 Essential (primary) hypertension: Secondary | ICD-10-CM | POA: Diagnosis not present

## 2018-04-04 DIAGNOSIS — K5732 Diverticulitis of large intestine without perforation or abscess without bleeding: Secondary | ICD-10-CM | POA: Insufficient documentation

## 2018-04-04 DIAGNOSIS — Z87891 Personal history of nicotine dependence: Secondary | ICD-10-CM | POA: Diagnosis not present

## 2018-04-04 DIAGNOSIS — J449 Chronic obstructive pulmonary disease, unspecified: Secondary | ICD-10-CM | POA: Insufficient documentation

## 2018-04-04 DIAGNOSIS — R350 Frequency of micturition: Secondary | ICD-10-CM | POA: Diagnosis present

## 2018-04-04 LAB — CBC
HEMATOCRIT: 36.3 % (ref 36.0–46.0)
HEMOGLOBIN: 12.4 g/dL (ref 12.0–15.0)
MCH: 30.5 pg (ref 26.0–34.0)
MCHC: 34.2 g/dL (ref 30.0–36.0)
MCV: 89.2 fL (ref 78.0–100.0)
Platelets: 132 10*3/uL — ABNORMAL LOW (ref 150–400)
RBC: 4.07 MIL/uL (ref 3.87–5.11)
RDW: 14.5 % (ref 11.5–15.5)
WBC: 3.8 10*3/uL — ABNORMAL LOW (ref 4.0–10.5)

## 2018-04-04 LAB — COMPREHENSIVE METABOLIC PANEL
ALK PHOS: 69 U/L (ref 38–126)
ALT: 30 U/L (ref 0–44)
ANION GAP: 10 (ref 5–15)
AST: 39 U/L (ref 15–41)
Albumin: 3.2 g/dL — ABNORMAL LOW (ref 3.5–5.0)
BILIRUBIN TOTAL: 1.6 mg/dL — AB (ref 0.3–1.2)
BUN: 15 mg/dL (ref 8–23)
CALCIUM: 8.8 mg/dL — AB (ref 8.9–10.3)
CO2: 27 mmol/L (ref 22–32)
Chloride: 102 mmol/L (ref 98–111)
Creatinine, Ser: 1.11 mg/dL — ABNORMAL HIGH (ref 0.44–1.00)
GFR, EST AFRICAN AMERICAN: 57 mL/min — AB (ref >60)
GFR, EST NON AFRICAN AMERICAN: 49 mL/min — AB (ref >60)
Glucose, Bld: 98 mg/dL (ref 70–99)
POTASSIUM: 4 mmol/L (ref 3.5–5.1)
Sodium: 139 mmol/L (ref 135–145)
Total Protein: 6.9 g/dL (ref 6.5–8.1)

## 2018-04-04 LAB — URINALYSIS, ROUTINE W REFLEX MICROSCOPIC
Bilirubin Urine: NEGATIVE
GLUCOSE, UA: NEGATIVE mg/dL
HGB URINE DIPSTICK: NEGATIVE
Ketones, ur: NEGATIVE mg/dL
LEUKOCYTES UA: NEGATIVE
Nitrite: NEGATIVE
PH: 5 (ref 5.0–8.0)
Protein, ur: NEGATIVE mg/dL
Specific Gravity, Urine: 1.017 (ref 1.005–1.030)

## 2018-04-04 MED ORDER — METRONIDAZOLE 500 MG PO TABS: 500.0000 mg | ORAL_TABLET | Freq: Three times a day (TID) | ORAL | 0 refills | Status: DC

## 2018-04-04 MED ORDER — CIPROFLOXACIN HCL 500 MG PO TABS: 500.0000 mg | ORAL_TABLET | Freq: Two times a day (BID) | ORAL | 0 refills | Status: DC

## 2018-04-04 MED ORDER — SODIUM CHLORIDE 0.9 % IV BOLUS
1000.0000 mL | Freq: Once | INTRAVENOUS | Status: AC
  Administered 2018-04-04: 1000 mL via INTRAVENOUS

## 2018-04-04 MED ORDER — ONDANSETRON HCL 4 MG/2ML IJ SOLN: 4.0000 mg | Freq: Once | INTRAMUSCULAR | Status: DC

## 2018-04-04 MED ORDER — IOPAMIDOL (ISOVUE-300) INJECTION 61%
100.0000 mL | Freq: Once | INTRAVENOUS | Status: AC | PRN
  Administered 2018-04-04: 100 mL via INTRAVENOUS

## 2018-04-04 NOTE — ED Provider Notes (Signed)
Donna Duran DEPT Provider Note   CSN: [id number] Arrival date & time: 04/04/18  2036     History   Chief Complaint Chief Complaint  Patient presents with  . Dysuria  . Urinary Frequency    HPI Donna Duran is a 72 y.o. female.  The history is provided by the patient. No language interpreter was used.  Dysuria   Associated symptoms include frequency.  Urinary Frequency      72 year old female with history of hypertension, COPD, hyperlipidemia brought here via EMS from home for evaluation of urinary discomfort.  Patient states for the past month whenever she urinates she noticed strong urine odor and dark urine.  She also endorsed recurrent lower abdominal cramping.  Abdominal pain mild, intermittent, nonradiating.  She endorsed decrease in appetite, feeling weak and not able to perform her daily activities.  Her son has to care for her.  She also endorsed increased urinary frequency.  She reports subjective fever.  She denies chest pain or shortness of breath or cough.  She denies seeing blood in her urine or having vaginal discharge.  Patient states she has been seen in the hospital several times within the past month and states that each time they checked her urine did not show any signs of urinary tract infection.  She reports her frustration.  Past Medical History:  Diagnosis Date  . Chest pain    nuclear study 02-2010 that was negative for ischemia, EF 70%  . COPD (chronic obstructive pulmonary disease) ([place])   . HTN (hypertension)   . Hyperlipemia   . SOB (shortness of breath)    copd    Patient Active Problem List   Diagnosis Date Noted  . SVT (supraventricular tachycardia) ([place]) 03/11/2018  . Shortness of breath 02/12/2014  . Morbid obesity ([place]) 02/12/2014  . HTN (hypertension) 01/01/2013  . Hyperlipidemia 01/01/2013  . Varicose veins of lower extremities with other complications 35/32/9924    Past Surgical History:  Procedure  Laterality Date  . ABDOMINAL HYSTERECTOMY       OB History   None      Home Medications    Prior to Admission medications   Medication Sig Start Date End Date Taking? Authorizing Provider  ALPRAZolam Duanne Moron) 0.25 MG tablet Take by mouth. 03/20/18   [provider]  cephALEXin (KEFLEX) 500 MG capsule Take 1 capsule (500 mg total) by mouth 3 (three) times daily. Patient not taking: Reported on 03/27/2018 03/04/18   Varney Biles, MD  Cholecalciferol (VITAMIN D) 2000 UNITS tablet Take 2,000 Units by mouth daily.    [provider]  diltiazem (CARDIZEM) 30 MG tablet Take 1 tablet by mouth as needed for sustained heart rate greater than 150. Max twice daily. 03/07/18   Hilty, Nadean Corwin, MD  FLUoxetine (PROZAC) 20 MG capsule Take 40 mg by mouth daily. Takes 2 tablets daily    [provider]  fluticasone furoate-vilanterol (BREO ELLIPTA) 100-25 MCG/INH AEPB Inhale 1 puff into the lungs daily as needed (sob and wheezing).    [provider]  furosemide (LASIX) 40 MG tablet TK 1 T PO QD FOR LEG PRN SWELLING AND SOB 12/19/17   [provider]  ipratropium-albuterol (DUONEB) 0.5-2.5 (3) MG/3ML SOLN INHALE 3 ML BID PRN FOR SOB AND WHEEZING 01/06/18   [provider]  predniSONE (DELTASONE) 10 MG tablet Take 5 tablets (50 mg total) by mouth daily. 03/04/18   Varney Biles, MD  traMADol (ULTRAM) 50 MG tablet Take 50  mg by mouth as needed for pain.    [provider]  zolpidem (AMBIEN) 10 MG tablet Take 10 mg by mouth at bedtime.     [provider]    Family History Family History  Problem Relation Age of Onset  . Heart failure Mother   . Cancer Paternal Grandfather   . Hypertension Brother   . COPD Sister     Social History Social History   Tobacco Use  . Smoking status: Former Smoker    Packs/day: 0.50    Years: 10.00    Pack years: 5.00    Types: Cigarettes    Last attempt to quit: 07/30/1982    Years since quitting:  35.7  . Smokeless tobacco: Never Used  Substance Use Topics  . Alcohol use: Yes    Comment: occasionally  . Drug use: Never     Allergies   Morphine and related   Review of Systems Review of Systems  Genitourinary: Positive for dysuria and frequency.  All other systems reviewed and are negative.    Physical Exam Updated Vital Signs BP 120/63   Pulse 92   Temp 98.9 F (37.2 C) (Oral)   Resp 18   SpO2 94% Comment: 87 without o2  Physical Exam  Constitutional: She appears well-developed and well-nourished. No distress.  Obese female appears uncomfortable.  HENT:  Head: Atraumatic.  Mouth is dry  Eyes: Conjunctivae are normal.  Neck: Neck supple.  Cardiovascular: Normal rate and regular rhythm.  Pulmonary/Chest: Effort normal and breath sounds normal.  Abdominal: Soft. There is tenderness (Mild diffuse abdominal tenderness without focal point tenderness).  Neurological: She is alert.  Skin: No rash noted.  Psychiatric: She has a normal mood and affect.  Nursing note and vitals reviewed.    ED Treatments / Results  Labs (all labs ordered are listed, but only abnormal results are displayed) Labs Reviewed  URINALYSIS, ROUTINE W REFLEX MICROSCOPIC - Abnormal; Notable for the following components:      Result Value   Color, Urine AMBER (*)    All other components within normal limits  CBC - Abnormal; Notable for the following components:   WBC 3.8 (*)    Platelets 132 (*)    All other components within normal limits  COMPREHENSIVE METABOLIC PANEL - Abnormal; Notable for the following components:   Creatinine, Ser 1.11 (*)    Calcium 8.8 (*)    Albumin 3.2 (*)    Total Bilirubin 1.6 (*)    GFR calc non Af Amer 49 (*)    GFR calc Af Amer 57 (*)    All other components within normal limits  URINE CULTURE    EKG None  Radiology No results found.  Procedures Procedures (including critical care time)  Medications Ordered in ED Medications  ondansetron  [facility]) injection 4 mg (0 mg Intravenous Hold 04/04/18 2126)  sodium chloride 0.9 % bolus 1,000 mL (1,000 mLs Intravenous New Bag/Given 04/04/18 2133)     Initial Impression / Assessment and Plan / ED Course  I have reviewed the triage vital signs and the nursing notes.  Pertinent labs & imaging results that were available during my care of the patient were reviewed by me and considered in my medical decision making (see chart for details).     BP 119/79   Pulse 63   Temp 98.9 F (37.2 C) (Oral)   Resp 17   SpO2 97%    Final Clinical Impressions(s) / ED Diagnoses   Final  diagnoses:  None    ED Discharge Orders    None     9:21 PM Patient report having urinary discomfort and strong urine odor for nearly a month.  Work-up initiated.  10:18 PM Urine without evidence of urinary tract infection.  Patient is sexually active for pelvic examination is deferred.  Given her recurrent lower abdominal pain and her age, will obtain abdominal pelvic CT scan for further evaluation.  Patient made aware of plan.  Mild renal insufficiency with creatinine 1.11, IV fluid given.  Care discussed with Dr. Regenia Skeeter  10:31 PM Pt signed out to Wallis Bamberg, PA-C who will f/u on CT scan and will dispo as appropriate. Urine culture sent.    Domenic Moras, PA-C 04/04/18 2231    Sherwood Gambler, MD 04/04/18 [phone]

## 2018-04-04 NOTE — ED Notes (Signed)
Bed: WA02 Expected date:  Expected time:  Means of arrival:  Comments: 72 yr old fever, UTI

## 2018-04-04 NOTE — ED Triage Notes (Signed)
Pt is presenting with c/o urinary frequency, pain with urination and noticing an odor to the urine. She states that is does not feel well, and explains that she feels weak and sick. Denies chest pain or shortness of breath at this time.

## 2018-04-04 NOTE — ED Notes (Signed)
Pt unable to urinate.

## 2018-04-05 NOTE — ED Provider Notes (Signed)
12:08 AM Patient care assumed from Donna Moras, PA-C at change of shift.  72 year old female with history of hypertension, COPD, dyslipidemia presenting for dysuria.  Reporting darker and stronger urine color and odor, respectively.  Laboratory work-up at baseline.  Urinalysis does not suggest infection.  She was pending CT scan at change of shift to further evaluate symptoms.  CT scan reviewed which shows diverticulosis of the sigmoid colon.  There is evidence of inflammatory change in this area, possibly representing early diverticulitis.  Have discussed these findings with the patient who verbalizes understanding.  We will plan to start on ciprofloxacin and Flagyl for treatment of presumed diverticulitis.  Patient has been informed that ciprofloxacin should also cover for any infectious cause of her dysuria.  She has been instructed to follow-up with a primary care doctor to ensure resolution of her symptoms.  Return precautions discussed and provided. Patient discharged in stable condition with no unaddressed concerns.   Vitals:   04/04/18 2100 04/04/18 2212 04/04/18 2330  BP: 120/63 119/79 (!) 109/59  Pulse: 92 63 62  Resp: 18 17 17   Temp: 98.9 F (37.2 C)  98.8 F (37.1 C)  TempSrc: Oral  Oral  SpO2: 94% 97% 99%   Results for orders placed or performed during the hospital encounter of 04/04/18  Urinalysis, Routine w reflex microscopic- may I&O cath if menses  Result Value Ref Range   Color, Urine AMBER (A) YELLOW   APPearance CLEAR CLEAR   Specific Gravity, Urine 1.017 1.005 - 1.030   pH 5.0 5.0 - 8.0   Glucose, UA NEGATIVE NEGATIVE mg/dL   Hgb urine dipstick NEGATIVE NEGATIVE   Bilirubin Urine NEGATIVE NEGATIVE   Ketones, ur NEGATIVE NEGATIVE mg/dL   Protein, ur NEGATIVE NEGATIVE mg/dL   Nitrite NEGATIVE NEGATIVE   Leukocytes, UA NEGATIVE NEGATIVE  CBC  Result Value Ref Range   WBC 3.8 (L) 4.0 - 10.5 K/uL   RBC 4.07 3.87 - 5.11 MIL/uL   Hemoglobin 12.4 12.0 - 15.0 g/dL   HCT  36.3 36.0 - 46.0 %   MCV 89.2 78.0 - 100.0 fL   MCH 30.5 26.0 - 34.0 pg   MCHC 34.2 30.0 - 36.0 g/dL   RDW 14.5 11.5 - 15.5 %   Platelets 132 (L) 150 - 400 K/uL  Comprehensive metabolic panel  Result Value Ref Range   Sodium 139 135 - 145 mmol/L   Potassium 4.0 3.5 - 5.1 mmol/L   Chloride 102 98 - 111 mmol/L   CO2 27 22 - 32 mmol/L   Glucose, Bld 98 70 - 99 mg/dL   BUN 15 8 - 23 mg/dL   Creatinine, Ser 1.11 (H) 0.44 - 1.00 mg/dL   Calcium 8.8 (L) 8.9 - 10.3 mg/dL   Total Protein 6.9 6.5 - 8.1 g/dL   Albumin 3.2 (L) 3.5 - 5.0 g/dL   AST 39 15 - 41 U/L   ALT 30 0 - 44 U/L   Alkaline Phosphatase 69 38 - 126 U/L   Total Bilirubin 1.6 (H) 0.3 - 1.2 mg/dL   GFR calc non Af Amer 49 (L) >60 mL/min   GFR calc Af Amer 57 (L) >60 mL/min   Anion gap 10 5 - 15    Ct Abdomen Pelvis W Contrast  Result Date: 04/04/2018 CLINICAL DATA:  Abdominal pain and urinary frequency. EXAM: CT ABDOMEN AND PELVIS WITH CONTRAST TECHNIQUE: Multidetector CT imaging of the abdomen and pelvis was performed using the standard protocol following bolus administration of intravenous  contrast. CONTRAST:  124mL ISOVUE-300 IOPAMIDOL (ISOVUE-300) INJECTION 61% COMPARISON:  Donna Duran. FINDINGS: LOWER CHEST: There is no basilar pleural or apical pericardial effusion. HEPATOBILIARY: The hepatic contours and density are normal. There is no intra- or extrahepatic biliary dilatation. The gallbladder is normal. PANCREAS: The pancreatic parenchymal contours are normal and there is no ductal dilatation. There is no peripancreatic fluid collection. SPLEEN: Spleen is enlarged, measuring 15.7 cm in craniocaudal dimension. ADRENALS/URINARY TRACT: --Adrenal glands: Normal. --Right kidney/ureter: No hydronephrosis, nephroureterolithiasis, perinephric stranding or solid renal mass. --Left kidney/ureter: No hydronephrosis, nephroureterolithiasis, perinephric stranding or solid renal mass. --Urinary bladder: Normal for degree of distention  STOMACH/BOWEL: --Stomach/Duodenum: There is no hiatal hernia or other gastric abnormality. The duodenal course and caliber are normal. --Small bowel: No dilatation or inflammation. --Colon: There is sigmoid colon diverticulosis with mild wall thickening. Minimal adjacent stranding. --Appendix: Normal. VASCULAR/LYMPHATIC: Atherosclerotic calcification is present within the non-aneurysmal abdominal aorta, without hemodynamically significant stenosis. The portal vein, splenic vein, superior mesenteric vein and IVC are patent. No abdominal or pelvic lymphadenopathy. REPRODUCTIVE: Status post hysterectomy. No adnexal mass. MUSCULOSKELETAL. No bony spinal canal stenosis or focal osseous abnormality. OTHER: Donna Duran. IMPRESSION: 1. Diverticulosis of the sigmoid colon with mild wall thickening that may indicate early diverticulitis. No fluid collection or significant amount of inflammatory fat stranding. 2. Splenomegaly. 3.  Aortic Atherosclerosis (ICD10-I70.0). Electronically Signed   By: Ulyses Jarred M.D.   On: 04/04/2018 23:33      Antonietta Breach, PA-C 04/05/18 Noralyn Pick, MD 04/05/18 [phone]

## 2018-04-05 NOTE — Discharge Instructions (Signed)
Your CT scan is suggestive of early, mild diverticulitis.  We recommend that you take ciprofloxacin and Flagyl as prescribed.  Ciprofloxacin will also cover for a urinary tract infection, though your urinalysis today did not suggest infection at this time.  We recommend close follow-up with your primary care doctor to ensure resolution of your symptoms.  Return to the emergency department, as needed, if symptoms worsen.

## 2018-04-06 LAB — URINE CULTURE: Culture: NO GROWTH

## 2018-04-09 ENCOUNTER — Telehealth: Payer: Self-pay | Admitting: Internal Medicine

## 2018-04-09 NOTE — Telephone Encounter (Signed)
New Message         Patient's daughter called states that her mom is sleeping 23 hours out of 24 hours a day. Patient has being sick for 2 weeks, not wanting  eat or drink. HR is up from normal. Patient is asleep right now and daughter is concern. Pls call and advise

## 2018-04-09 NOTE — Telephone Encounter (Signed)
Called patient daughter. She states that she is concerned with her mother, she is sleeping all day long, and she is very lethargic, she refuses to eat or drink. She took her to the hospital and they gave her fluids for being dehydrated. Daughter wants to just notify you that she will be bringing her back to the hospital because she feels that something is just not right. She did mention her having a heart cath, and I advised that there was other test that had to be completed and reasons to have that type of procedure completed, but that the ER was the best place for her if she was lethargic and possibly back to being dehydrated. Patient wife voiced understanding.

## 2018-04-15 ENCOUNTER — Encounter (HOSPITAL_BASED_OUTPATIENT_CLINIC_OR_DEPARTMENT_OTHER): Payer: Medicare HMO

## 2018-04-21 ENCOUNTER — Ambulatory Visit: Payer: Medicare HMO | Admitting: Pulmonary Disease

## 2018-04-21 ENCOUNTER — Telehealth: Payer: Self-pay

## 2018-04-21 NOTE — Telephone Encounter (Signed)
VM left to introduce Palliative Care and to offer to schedule visit with NP. Call back information also left

## 2018-04-24 ENCOUNTER — Ambulatory Visit: Payer: Medicare HMO | Admitting: Internal Medicine

## 2018-04-26 ENCOUNTER — Other Ambulatory Visit: Payer: Self-pay | Admitting: Pulmonary Disease

## 2018-05-06 ENCOUNTER — Telehealth: Payer: Self-pay

## 2018-05-06 NOTE — Telephone Encounter (Signed)
Phone call placed to patient to offer to schedule visit with Palliative Care. VM left 

## 2018-05-15 ENCOUNTER — Telehealth: Payer: Self-pay

## 2018-05-15 NOTE — Telephone Encounter (Signed)
Phone call placed to patient to introduce Palliative Care and to schedule visit with NP. Patient stated that she could not talk at that moment and to return call tomorrow.

## 2018-05-20 ENCOUNTER — Telehealth: Payer: Self-pay

## 2018-05-20 NOTE — Telephone Encounter (Signed)
Phone call placed to patient to offer to schedule a visit with Palliative Care. Patient stated that she cannot talk right now. Inquired if she would like to have Palliative Care services. Patient shared that she did not want Palliative care at this time and will contact PCP if/when she does.

## 2018-05-20 NOTE — Telephone Encounter (Signed)
Palliative care note faxed to PCP to make him aware that patient declined Palliative Care at this time.

## 2020-01-08 ENCOUNTER — Telehealth: Payer: Self-pay | Admitting: Hematology and Oncology

## 2020-01-08 NOTE — Telephone Encounter (Signed)
Received a new hem referral from [facility] for thrombocytopenia. Ms. Donna Duran has been cld and scheduled to see Dr. Lorenso Courier on 6/17 at 1pm. Pt aware to arrive 15 minutes early.

## 2020-01-14 ENCOUNTER — Other Ambulatory Visit: Payer: Self-pay

## 2020-01-14 ENCOUNTER — Inpatient Hospital Stay: Payer: Medicare HMO

## 2020-01-14 ENCOUNTER — Inpatient Hospital Stay: Payer: Medicare HMO | Attending: Hematology and Oncology | Admitting: Hematology and Oncology

## 2020-01-14 VITALS — BP 130/78 | HR 90 | Temp 97.5°F | Resp 20 | Wt 207.3 lb

## 2020-01-14 DIAGNOSIS — D696 Thrombocytopenia, unspecified: Secondary | ICD-10-CM

## 2020-01-14 DIAGNOSIS — D708 Other neutropenia: Secondary | ICD-10-CM

## 2020-01-14 DIAGNOSIS — K7460 Unspecified cirrhosis of liver: Secondary | ICD-10-CM | POA: Insufficient documentation

## 2020-01-14 DIAGNOSIS — I10 Essential (primary) hypertension: Secondary | ICD-10-CM | POA: Diagnosis not present

## 2020-01-14 DIAGNOSIS — D72810 Lymphocytopenia: Secondary | ICD-10-CM | POA: Insufficient documentation

## 2020-01-14 DIAGNOSIS — J449 Chronic obstructive pulmonary disease, unspecified: Secondary | ICD-10-CM | POA: Diagnosis not present

## 2020-01-14 DIAGNOSIS — Z809 Family history of malignant neoplasm, unspecified: Secondary | ICD-10-CM | POA: Insufficient documentation

## 2020-01-14 DIAGNOSIS — Z87891 Personal history of nicotine dependence: Secondary | ICD-10-CM | POA: Diagnosis not present

## 2020-01-14 DIAGNOSIS — D6959 Other secondary thrombocytopenia: Secondary | ICD-10-CM | POA: Insufficient documentation

## 2020-01-14 DIAGNOSIS — E785 Hyperlipidemia, unspecified: Secondary | ICD-10-CM | POA: Diagnosis not present

## 2020-01-14 LAB — CBC WITH DIFFERENTIAL (CANCER CENTER ONLY)
Abs Immature Granulocytes: 0 10*3/uL (ref 0.00–0.07)
Basophils Absolute: 0 10*3/uL (ref 0.0–0.1)
Basophils Relative: 2 %
Eosinophils Absolute: 0 10*3/uL (ref 0.0–0.5)
Eosinophils Relative: 2 %
HCT: 38.4 % (ref 36.0–46.0)
Hemoglobin: 13.1 g/dL (ref 12.0–15.0)
Immature Granulocytes: 0 %
Lymphocytes Relative: 18 %
Lymphs Abs: 0.5 10*3/uL — ABNORMAL LOW (ref 0.7–4.0)
MCH: 29.9 pg (ref 26.0–34.0)
MCHC: 34.1 g/dL (ref 30.0–36.0)
MCV: 87.7 fL (ref 80.0–100.0)
Monocytes Absolute: 0.2 10*3/uL (ref 0.1–1.0)
Monocytes Relative: 9 %
Neutro Abs: 1.8 10*3/uL (ref 1.7–7.7)
Neutrophils Relative %: 69 %
Platelet Count: 90 10*3/uL — ABNORMAL LOW (ref 150–400)
RBC: 4.38 MIL/uL (ref 3.87–5.11)
RDW: 12.2 % (ref 11.5–15.5)
WBC Count: 2.6 10*3/uL — ABNORMAL LOW (ref 4.0–10.5)
nRBC: 0 % (ref 0.0–0.2)

## 2020-01-14 LAB — CMP (CANCER CENTER ONLY)
ALT: 21 U/L (ref 0–44)
AST: 38 U/L (ref 15–41)
Albumin: 3.8 g/dL (ref 3.5–5.0)
Alkaline Phosphatase: 78 U/L (ref 38–126)
Anion gap: 11 (ref 5–15)
BUN: 11 mg/dL (ref 8–23)
CO2: 23 mmol/L (ref 22–32)
Calcium: 9.5 mg/dL (ref 8.9–10.3)
Chloride: 106 mmol/L (ref 98–111)
Creatinine: 1.06 mg/dL — ABNORMAL HIGH (ref 0.44–1.00)
GFR, Est AFR Am: >60 mL/min (ref >60)
GFR, Estimated: 52 mL/min — ABNORMAL LOW (ref >60)
Glucose, Bld: 101 mg/dL — ABNORMAL HIGH (ref 70–99)
Potassium: 3.9 mmol/L (ref 3.5–5.1)
Sodium: 140 mmol/L (ref 135–145)
Total Bilirubin: 1.6 mg/dL — ABNORMAL HIGH (ref 0.3–1.2)
Total Protein: 7.6 g/dL (ref 6.5–8.1)

## 2020-01-14 LAB — LACTATE DEHYDROGENASE: LDH: 196 U/L — ABNORMAL HIGH (ref 98–192)

## 2020-01-14 LAB — SAVE SMEAR(SSMR), FOR PROVIDER SLIDE REVIEW

## 2020-01-14 LAB — FOLATE: Folate: 5.6 ng/mL — ABNORMAL LOW (ref >5.9)

## 2020-01-14 LAB — RETIC PANEL
Immature Retic Fract: 3.7 % (ref 2.3–15.9)
RBC.: 4.39 MIL/uL (ref 3.87–5.11)
Retic Count, Absolute: 56.6 10*3/uL (ref 19.0–186.0)
Retic Ct Pct: 1.3 % (ref 0.4–3.1)
Reticulocyte Hemoglobin: 34.7 pg (ref >27.9)

## 2020-01-14 LAB — VITAMIN B12: Vitamin B-12: 188 pg/mL (ref 180–914)

## 2020-01-14 NOTE — Progress Notes (Signed)
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 No cirrhotic morphology noted at that time.  3) 04/02/2019: WBC 3.6, Hgb 14.6, MCV 92, Plt 117 4) 01/14/2020: Establish care with Dr. Lorenso Courier WBC 2.6, Hgb 13.1, MCV 87.7, Plt 90  CHIEF COMPLAINTS/PURPOSE OF CONSULTATION:  "Thrombocytopenia "  HISTORY OF PRESENTING ILLNESS:  Donna Duran 74 y.o. female with medical history significant for COPD, HTN, HLD, and cirrhosis of unclear etiology who presents for evaluation of thrombocytopenia and leukopenia.   On review of the previous records Donna Duran has a thrombocytopenia dating back to at least 04/12/2011.  At that time she was noted to have white blood cell count 2.6, hemoglobin 13.2, and a platelet count of 142.  More recently in September 2019 the patient a CT abdomen pelvis performed which showed findings consistent with diverticulosis and splenomegaly.  No cirrhotic nephrology was appreciated at that time.  On 04/02/2019 the patient was found have white blood cell count 3.6, hemoglobin of 14.6, and a platelet count of 117.  Due to concerns for this patient's longstanding leukopenia and thrombocytopenia she was referred to hematology for further evaluation management.  On exam today Donna Duran notes that she is currently under the care of a hepatologist.  She reports that a biopsy has been offered but she has declined at this time.  She reports that she was previously a smoker but quit smoking in 1984 after smoking on and off since the age of 62.  She reports that she was never medically heavy smoker and only smoked about 1/2 pack/day.  She notes  that she also has very low alcohol use and has decreased it since discovering she had liver difficulties.  She notes that her health has been in decline since approximately 2 years ago when she had a episode where she was "really sick" and was unclear what she had.  She notes that she had diminished energy after that point in time and that her primary care provider thought it was "a nervous breakdown".  Of note the patient has recurrent bladder infections for which her PCP is considering prophylactic antibiotics.  On further discussion the patient notes that she has been having issues with nosebleeds.  She reports that in April she had an episode of bronchitis and for approximately 1 month she was having frequent episodes of nosebleeds, with a total of 4 during that time period.  She notes that she has not had any nosebleeds since that time.  When discussing her diet she notes that there are certain foods that she must avoid because of her diverticulosis, but otherwise does not have any restrictions.  Her primary complaint today is fatigue.  She denies having any issues with fevers, chills, sweats, nausea, vomiting or diarrhea.  A full 10 point ROS is listed below.  MEDICAL HISTORY:  Past Medical History:  Diagnosis Date  . Chest pain    nuclear study 02-2010 that was negative for ischemia, EF 70%  . COPD (chronic obstructive pulmonary disease) ([place])   . HTN (hypertension)   . Hyperlipemia   . SOB (shortness of breath)    copd    SURGICAL HISTORY: Past Surgical History:  Procedure Laterality Date  .  ABDOMINAL HYSTERECTOMY      SOCIAL HISTORY: Social History   Socioeconomic History  . Marital status: Divorced    Spouse name: Not on file  . Number of children: 2  . Years of education: Not on file  . Highest education level: Not on file  Occupational History  . Not on file  Tobacco Use  . Smoking status: Former Smoker    Packs/day: 0.50    Years: 10.00    Pack years: 5.00    Types:  Cigarettes    Quit date: 07/30/1982    Years since quitting: 37.4  . Smokeless tobacco: Never Used  Vaping Use  . Vaping Use: Never used  Substance and Sexual Activity  . Alcohol use: Yes    Comment: occasionally  . Drug use: Never  . Sexual activity: Not on file  Other Topics Concern  . Not on file  Social History Narrative  . Not on file   Social Determinants of Health   Financial Resource Strain:   . Difficulty of Paying Living Expenses:   Food Insecurity:   . Worried About Charity fundraiser in the Last Year:   . Arboriculturist in the Last Year:   Transportation Needs:   . Film/video editor (Medical):   Marland Kitchen Lack of Transportation (Non-Medical):   Physical Activity:   . Days of Exercise per Week:   . Minutes of Exercise per Session:   Stress:   . Feeling of Stress :   Social Connections:   . Frequency of Communication with Friends and Family:   . Frequency of Social Gatherings with Friends and Family:   . Attends Religious Services:   . Active Member of Clubs or Organizations:   . Attends Archivist Meetings:   Marland Kitchen Marital Status:   Intimate Partner Violence:   . Fear of Current or Ex-Partner:   . Emotionally Abused:   Marland Kitchen Physically Abused:   . Sexually Abused:     FAMILY HISTORY: Family History  Problem Relation Age of Onset  . Heart failure Mother   . Cancer Paternal Grandfather   . Hypertension Brother   . COPD Sister     ALLERGIES:  is allergic to morphine and related.  MEDICATIONS:  Current Outpatient Medications  Medication Sig Dispense Refill  . albuterol (PROVENTIL HFA;VENTOLIN HFA) 108 (90 Base) MCG/ACT inhaler INHALE 2 PUFFS INTO THE LUNGS EVERY 6 HOURS AS NEEDED FOR WHEEZING 18 g 0  . aspirin 81 MG tablet Take 81 mg by mouth daily.     . Cholecalciferol (VITAMIN D) 2000 UNITS tablet Take 2,000 Units by mouth daily.    Marland Kitchen FLUoxetine (PROZAC) 20 MG capsule Take 40 mg by mouth daily.     . fluticasone furoate-vilanterol (BREO ELLIPTA)  100-25 MCG/INH AEPB Inhale 1 puff into the lungs daily as needed (sob and wheezing).    . Fluticasone-Umeclidin-Vilant (TRELEGY ELLIPTA) 100-62.5-25 MCG/INH AEPB Inhale 1 puff into the lungs daily.    Marland Kitchen ipratropium-albuterol (DUONEB) 0.5-2.5 (3) MG/3ML SOLN Inhale 3 mLs into the lungs daily.   4  . traMADol (ULTRAM) 50 MG tablet Take 50 mg by mouth every 6 (six) hours as needed for moderate pain.     Marland Kitchen zolpidem (AMBIEN) 10 MG tablet Take 10 mg by mouth at bedtime.      No current facility-administered medications for this visit.    REVIEW OF SYSTEMS:   Constitutional: ( - ) fevers, ( - )  chills , ( - )  night sweats Eyes: ( - ) blurriness of vision, ( - ) double vision, ( - ) watery eyes Ears, nose, mouth, throat, and face: ( - ) mucositis, ( - ) sore throat Respiratory: ( - ) cough, ( - ) dyspnea, ( - ) wheezes Cardiovascular: ( - ) palpitation, ( - ) chest discomfort, ( - ) lower extremity swelling Gastrointestinal:  ( - ) nausea, ( - ) heartburn, ( - ) change in bowel habits Skin: ( - ) abnormal skin rashes Lymphatics: ( - ) new lymphadenopathy, ( - ) easy bruising Neurological: ( - ) numbness, ( - ) tingling, ( - ) new weaknesses Behavioral/Psych: ( - ) mood change, ( - ) new changes  All other systems were reviewed with the patient and are negative.  PHYSICAL EXAMINATION: ECOG PERFORMANCE STATUS: 1 - Symptomatic but completely ambulatory  Vitals:   01/14/20 1344  BP: 130/78  Pulse: 90  Resp: 20  Temp: (!) 97.5 F (36.4 C)  SpO2: 92%   Filed Weights   01/14/20 1344  Weight: 207 lb 4.8 oz (94 kg)    GENERAL: well appearing elderly Caucasian female in NAD  SKIN: skin color, texture, turgor are normal, no rashes or significant lesions EYES: conjunctiva are pink and non-injected, sclera clear LUNGS: clear to auscultation and percussion with normal breathing effort HEART: regular rate & rhythm and no murmurs and no lower extremity edema ABDOMEN: soft, non-tender,  non-distended, normal bowel sounds. No HSM appreciated.  Musculoskeletal: no cyanosis of digits and no clubbing  PSYCH: alert & oriented x 3, fluent speech NEURO: no focal motor/sensory deficits  LABORATORY DATA:  I have reviewed the data as listed CBC Latest Ref Rng & Units 01/14/2020 04/04/2018 03/27/2018  WBC 4.0 - 10.5 K/uL 2.6(L) 3.8(L) 7.1  Hemoglobin 12.0 - 15.0 g/dL 13.1 12.4 14.3  Hematocrit 36 - 46 % 38.4 36.3 41.9  Platelets 150 - 400 K/uL 90(L) 132(L) 139.0(L)    CMP Latest Ref Rng & Units 01/14/2020 04/04/2018 03/04/2018  Glucose 70 - 99 mg/dL 101(H) 98 135(H)  BUN 8 - 23 mg/dL 11 15 12   Creatinine 0.44 - 1.00 mg/dL 1.06(H) 1.11(H) 1.02(H)  Sodium 135 - 145 mmol/L 140 139 138  Potassium 3.5 - 5.1 mmol/L 3.9 4.0 4.0  Chloride 98 - 111 mmol/L 106 102 103  CO2 22 - 32 mmol/L 23 27 25   Calcium 8.9 - 10.3 mg/dL 9.5 8.8(L) 9.6  Total Protein 6.5 - 8.1 g/dL 7.6 6.9 -  Total Bilirubin 0.3 - 1.2 mg/dL 1.6(H) 1.6(H) -  Alkaline Phos 38 - 126 U/L 78 69 -  AST 15 - 41 U/L 38 39 -  ALT 0 - 44 U/L 21 30 -    BLOOD FILM:  Review of the peripheral blood smear showed normal appearing white cells with neutrophils that were appropriately lobated and granulated. There was no predominance of bi-lobed or hyper-segmented neutrophils appreciated. No Dohle bodies were noted. There was no left shifting, immature forms or blasts noted. Lymphocytes remain normal in size without any predominance of large granular lymphocytes. Red cells show no anisopoikilocytosis, macrocytes , microcytes or polychromasia. There were no schistocytes, target cells, echinocytes, acanthocytes, dacrocytes, or stomatocytes.There was no rouleaux formation, nucleated red cells, or intra-cellular inclusions noted. The platelets are normal in size, shape, and color without any clumping evident.  RADIOGRAPHIC STUDIES: No results found.  ASSESSMENT & PLAN Donna Duran 74 y.o. female with medical history significant for COPD, HTN,  HLD, and cirrhosis of  unclear etiology who presents for evaluation of thrombocytopenia and leukopenia.  After review of the labs, discussion with the patient, and review the outside record the patient's findings are most consistent with thrombocytopenia and leukopenia in the setting of liver disease.  The patient does have a known history of cirrhosis with some splenomegaly and the etiology of the cirrhosis is unclear.  The patient does not have medically heavy alcohol history and serological work-up so far has been unrevealing.  A liver biopsy would be preferred, but the patient is declining at this time.  In order to rule out alternative etiologies of her thrombocytopenia and leukopenia today we will order full nutritional panel to include vitamin G83, folate, metabolic acid, and homocystine.  Review of peripheral blood film did not show any clumping of the platelets or other hematological abnormalities.  I would recommend that she return to clinic in approximately 6 months time in order to assure that she is not continuing to trend downward.  If another etiology is found or her counts were to suddenly worsen we could certainly bring her back to clinic earlier.  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 Encourage continued workup with hepatology -- in order to r/o alternative etiologies will order vitamin b12, folate, MMA, and homocysteine --review of peripheral blood film shows no clumping or other hematological abnormalities.  --RTC in 6 months time or sooner if another etiology is found or counts worsen.    Orders Placed This Encounter  Procedures  . CBC with Differential ([facility] Only)    Standing Status:   Future    Number of Occurrences:   1    Standing Expiration Date:   01/13/2021  . Retic Panel    Standing Status:   Future    Number of  Occurrences:   1    Standing Expiration Date:   01/13/2021  . Save Smear (SSMR)    Standing Status:   Future    Number of Occurrences:   1    Standing Expiration Date:   01/13/2021  . CMP ([facility] only)    Standing Status:   Future    Number of Occurrences:   1    Standing Expiration Date:   01/13/2021  . Lactate dehydrogenase (LDH)    Standing Status:   Future    Number of Occurrences:   1    Standing Expiration Date:   01/13/2021  . Vitamin B12    Standing Status:   Future    Number of Occurrences:   1    Standing Expiration Date:   01/13/2021  . Folate, Serum    Standing Status:   Future    Number of Occurrences:   1    Standing Expiration Date:   01/13/2021  . Methylmalonic acid, serum    Standing Status:   Future    Number of Occurrences:   1    Standing Expiration Date:   01/13/2021  . Homocysteine, serum    Standing Status:   Future    Number of Occurrences:   1    Standing Expiration Date:   01/13/2021    All questions were answered. The patient knows to call the clinic with any problems, questions or concerns.  A total of more than 60 minutes were spent on this encounter and over half of that time was spent on counseling and coordination  of care as outlined above.   Ledell Peoples, MD Department of Hematology/Oncology [place] at [facility] Phone: [phone] Pager: [phone] Email: [email or web address]  01/15/2020 5:41 PM

## 2020-01-15 ENCOUNTER — Encounter: Payer: Self-pay | Admitting: Hematology and Oncology

## 2020-01-15 LAB — HOMOCYSTEINE: Homocysteine: 22.8 umol/L — ABNORMAL HIGH (ref 0.0–19.2)

## 2020-01-18 ENCOUNTER — Telehealth: Payer: Self-pay

## 2020-01-18 LAB — METHYLMALONIC ACID, SERUM: Methylmalonic Acid, Quantitative: 189 nmol/L (ref 0–378)

## 2020-01-18 NOTE — Telephone Encounter (Signed)
-----   Message from Otila Kluver, RN sent at 01/18/2020  3:35 PM EDT -----  ----- Message ----- From: Orson Slick, MD Sent: 01/18/2020   9:02 AM EDT To: Otila Kluver, RN  Please let Mrs. Kercher know that her blood work shows low levels of folate and vitamin b12. We will need to call in 1020mcg vitamin b12 PO daily and 1mg  folic acid PO daily to a pharmacy of her choice.   She currently has a 6 month f/u scheduled, we will need to see her sooner with labs (preferably 3 months from now).   Colan Neptune ----- Message ----- From: Buel Ream, Lab In [place] Sent: 01/14/2020   2:56 PM EDT To: Orson Slick, MD

## 2020-01-18 NOTE — Telephone Encounter (Signed)
Attempted to call patient message left with number to return call

## 2020-01-19 ENCOUNTER — Other Ambulatory Visit: Payer: Self-pay

## 2020-01-19 ENCOUNTER — Telehealth: Payer: Self-pay | Admitting: Hematology and Oncology

## 2020-01-19 MED ORDER — FOLIC ACID 1 MG PO TABS: 1.0000 mg | ORAL_TABLET | Freq: Every day | ORAL | 3 refills | Status: AC

## 2020-01-19 MED ORDER — CYANOCOBALAMIN 500 MCG PO TABS: 1000.0000 ug | ORAL_TABLET | Freq: Every day | ORAL | 3 refills | Status: AC

## 2020-01-19 NOTE — Progress Notes (Signed)
Patient returned call from yesterday in regards to lab work. Patient informed that per Dr. Lorenso Courier  her blood work shows low levels of folate and vitamin b12 and that a prescription has been sent in for 1025mcg vitamin b12 PO daily and 1mg  folic acid PO daily to [facility] on [address]. Patient also made aware that her follow up will be in 3 months with labs instead of 6 months. Patient verbalized understanding.   She currently has a 6 month f/u scheduled, we will need to see her sooner with labs (preferably 3 months from now).

## 2020-01-19 NOTE — Telephone Encounter (Signed)
Scheduled per los. Called and left msg. Mailed printout  °

## 2020-01-20 ENCOUNTER — Telehealth: Payer: Self-pay | Admitting: Hematology and Oncology

## 2020-01-20 NOTE — Telephone Encounter (Signed)
Rescheduled appt per 6/22 sch message - pt is aware of appt date and time

## 2020-04-14 ENCOUNTER — Inpatient Hospital Stay: Payer: Medicare HMO | Admitting: Hematology and Oncology

## 2020-04-14 ENCOUNTER — Inpatient Hospital Stay: Payer: Medicare HMO | Attending: Hematology and Oncology

## 2020-04-14 ENCOUNTER — Other Ambulatory Visit: Payer: Self-pay | Admitting: Hematology and Oncology

## 2020-04-14 DIAGNOSIS — D696 Thrombocytopenia, unspecified: Secondary | ICD-10-CM

## 2020-04-14 NOTE — Progress Notes (Signed)
Error   This encounter was created in error - please disregard. 

## 2020-07-14 ENCOUNTER — Other Ambulatory Visit: Payer: Medicare HMO

## 2020-07-14 ENCOUNTER — Ambulatory Visit: Payer: Medicare HMO | Admitting: Hematology and Oncology

## 2021-11-15 ENCOUNTER — Other Ambulatory Visit: Payer: Self-pay | Admitting: *Deleted

## 2021-11-15 DIAGNOSIS — M79606 Pain in leg, unspecified: Secondary | ICD-10-CM

## 2021-11-20 ENCOUNTER — Other Ambulatory Visit (HOSPITAL_COMMUNITY): Payer: Self-pay | Admitting: Surgery

## 2021-11-20 ENCOUNTER — Ambulatory Visit: Payer: Medicare HMO | Admitting: Surgery

## 2021-11-20 ENCOUNTER — Ambulatory Visit (INDEPENDENT_AMBULATORY_CARE_PROVIDER_SITE_OTHER)
Admission: RE | Admit: 2021-11-20 | Discharge: 2021-11-20 | Disposition: A | Discharge status: Home / Self Care | Payer: Medicare HMO | Source: Ambulatory Visit | Attending: Surgery | Admitting: Surgery

## 2021-11-20 ENCOUNTER — Ambulatory Visit (HOSPITAL_COMMUNITY)
Admission: RE | Admit: 2021-11-20 | Discharge: 2021-11-20 | Disposition: A | Discharge status: Home / Self Care | Payer: Medicare HMO | Source: Ambulatory Visit | Attending: Surgery | Admitting: Surgery

## 2021-11-20 ENCOUNTER — Encounter: Payer: Self-pay | Admitting: Surgery

## 2021-11-20 VITALS — BP 131/76 | HR 77 | Temp 97.9°F | Resp 20 | Ht 61.5 in | Wt 207.0 lb

## 2021-11-20 DIAGNOSIS — M79604 Pain in right leg: Secondary | ICD-10-CM

## 2021-11-20 DIAGNOSIS — M79606 Pain in leg, unspecified: Secondary | ICD-10-CM | POA: Insufficient documentation

## 2021-11-20 DIAGNOSIS — M25561 Pain in right knee: Secondary | ICD-10-CM | POA: Diagnosis not present

## 2021-11-20 DIAGNOSIS — M25562 Pain in left knee: Secondary | ICD-10-CM | POA: Diagnosis not present

## 2021-11-20 NOTE — Progress Notes (Signed)
? ?[facility] [place] ? ?Patient name: Donna Duran MRN: [id number] DOB: 07/27/1946 Sex: female ? ? ?REQUESTING PROVIDER:  ? ? Kellie Shropshire ? ? ?REASON FOR CONSULT:  ?  ?Leg pain ? ?HISTORY OF PRESENT ILLNESS:  ? ?HASSIE MANDT is a 76 y.o. female, who is here today for evaluation of leg cramping and spasms.  She has had several episodes that occur with no inciting factors.  It is usually at night.  She can rub them and this helps however they returned.  They can be debilitating.  She states that she saw Dr. 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 ? ?Patient does have issues with shortness of breath and fatigue.  She is also being evaluated for cirrhosis.  She suffers from COPD and hypertension.  She is a former smoker. ? ?PAST MEDICAL HISTORY  ? ? ?Past Medical History:  ?Diagnosis Date  ? Chest pain   ? nuclear study 02-2010 that was negative for ischemia, EF 70%  ? COPD (chronic obstructive pulmonary disease) ([place])   ? HTN (hypertension)   ? Hyperlipemia   ? SOB (shortness of breath)   ? copd  ? ? ? ?FAMILY HISTORY  ? ?Family History  ?Problem Relation Age of Onset  ? Heart failure Mother   ? Cancer Paternal Grandfather   ? Hypertension Brother   ? COPD Sister   ? ? ?SOCIAL HISTORY:  ? ?Social History  ? ?Socioeconomic History  ? Marital status: Divorced  ?  Spouse name: Not on file  ? Number of children: 2  ? Years of education: Not on file  ? Highest education level: Not on file  ?Occupational History  ? Not on file  ?Tobacco Use  ? Smoking status: Former  ?  Packs/day: 0.50  ?  Years: 10.00  ?  Pack years: 5.00  ?  Types: Cigarettes  ?  Quit date: 07/30/1982  ?  Years since quitting: 39.3  ? Smokeless tobacco: Never  ?Vaping Use  ? Vaping Use: Never used  ?Substance and Sexual Activity  ? Alcohol use: Yes  ?  Comment: occasionally  ? Drug use: Never  ? Sexual activity: Not on file  ?Other Topics Concern  ? Not on file  ?Social  History Narrative  ? Not on file  ? ?Social Determinants of Health  ? ?Financial Resource Strain: Not on file  ?Food Insecurity: Not on file  ?Transportation Needs: Not on file  ?Physical Activity: Not on file  ?Stress: Not on file  ?Social Connections: Not on file  ?Intimate Partner Violence: Not on file  ? ? ?ALLERGIES:  ? ? ?Allergies  ?Allergen Reactions  ? Morphine And Related Nausea And Vomiting  ? ? ?CURRENT MEDICATIONS:  ? ? ?Current Outpatient Medications  ?Medication Sig Dispense Refill  ? albuterol (PROVENTIL HFA;VENTOLIN HFA) 108 (90 Base) MCG/ACT inhaler INHALE 2 PUFFS INTO THE LUNGS EVERY 6 HOURS AS NEEDED FOR WHEEZING 18 g 0  ? aspirin 81 MG tablet Take 81 mg by mouth daily.     ? Cholecalciferol (VITAMIN D) 2000 UNITS tablet Take 2,000 Units by mouth daily.    ? FLUoxetine (PROZAC) 20 MG capsule Take 40 mg by mouth daily.     ? fluticasone furoate-vilanterol (BREO ELLIPTA) 100-25 MCG/INH AEPB Inhale 1 puff into the lungs daily as needed (sob and wheezing).    ? Fluticasone-Umeclidin-Vilant 100-62.5-25 MCG/INH AEPB Inhale 1 puff into the lungs  daily.    ? folic acid (FOLVITE) 1 MG tablet Take 1 tablet (1 mg total) by mouth daily. 30 tablet 3  ? ipratropium-albuterol (DUONEB) 0.5-2.5 (3) MG/3ML SOLN Inhale 3 mLs into the lungs daily.   4  ? traMADol (ULTRAM) 50 MG tablet Take 50 mg by mouth every 6 (six) hours as needed for moderate pain.     ? vitamin B-12 (CYANOCOBALAMIN) 500 MCG tablet Take 2 tablets (1,000 mcg total) by mouth daily. 60 tablet 3  ? zolpidem (AMBIEN) 10 MG tablet Take 10 mg by mouth at bedtime.    ? ?No current facility-administered medications for this visit.  ? ? ?REVIEW OF SYSTEMS:  ? ?'[X]'$  denotes positive finding, '[ ]'$  denotes negative finding ?Cardiac  Comments:  ?Chest pain or chest pressure:    ?Shortness of breath upon exertion:    ?Short of breath when lying flat:    ?Irregular heart rhythm:    ?    ?Vascular    ?Pain in calf, thigh, or hip brought on by ambulation:    ?Pain  in feet at night that wakes you up from your sleep:     ?Blood clot in your veins:    ?Leg swelling:  x   ?    ?Pulmonary    ?Oxygen at home:    ?Productive cough:     ?Wheezing:     ?    ?Neurologic    ?Sudden weakness in arms or legs:     ?Sudden numbness in arms or legs:     ?Sudden onset of difficulty speaking or slurred speech:    ?Temporary loss of vision in one eye:     ?Problems with dizziness:     ?    ?Gastrointestinal    ?Blood in stool:     ? ?Vomited blood:     ?    ?Genitourinary    ?Burning when urinating:     ?Blood in urine:    ?    ?Psychiatric    ?Major depression:     ?    ?Hematologic    ?Bleeding problems:    ?Problems with blood clotting too easily:    ?    ?Skin    ?Rashes or ulcers:    ?    ?Constitutional    ?Fever or chills:    ? ?PHYSICAL EXAM:  ? ?Vitals:  ? 11/20/21 1030  ?BP: 131/76  ?Pulse: 77  ?Resp: 20  ?Temp: 97.9 ?F (36.6 ?C)  ?SpO2: 92%  ?Weight: 207 lb (93.9 kg)  ?Height: 5' 1.5" (1.562 m)  ? ? ?GENERAL: The patient is a well-nourished female, in no acute distress. The vital signs are documented above. ?CARDIAC: There is a regular rate and rhythm.  ?VASCULAR: Palpable pedal pulses.  I looked in her thigh and groin area for varicose veins but did not see any ?PULMONARY: Nonlabored respirations ?ABDOMEN: Soft and non-tender with normal pitched bowel sounds.  ?MUSCULOSKELETAL: There are no major deformities or cyanosis. ?NEUROLOGIC: No focal weakness or paresthesias are detected. ?SKIN: There are no ulcers or rashes noted. ?PSYCHIATRIC: The patient has a normal affect. ? ?STUDIES:  ? ?I have reviewed the following vascular lab studies: ?+-------+-----------+-----------+------------+------------+  ?ABI/TBIToday's ABIToday's TBIPrevious ABIPrevious TBI  ?+-------+-----------+-----------+------------+------------+  ?Right  1.18       0.79       0.99                      ?+-------+-----------+-----------+------------+------------+  ?Left  1.15       0.85       0.96                       ?+-------+-----------+-----------+------------+------------+ ?Right toe pressure equals 114 ?Left toe pressure equals 123 ?All waveforms are triphasic ? ? ?Formal venous imaging was negative for DVT, superficial venous thrombosis, or varicosities ?ASSESSMENT and PLAN  ? ?Leg pain: The patient has been evaluated with formal arterial and venous testing.  All studies have been unremarkable.  I do not think that she has a vascular etiology to her leg cramping.  We discussed the possibility that this could be electrolyte imbalance as she is supposed to be taking potassium and magnesium supplements.  No further work-up recommended from a vascular perspective ? ? ?Annamarie Major, IV, MD, FACS ?[facility] ?Tel [phone] ?Pager [phone]  ?

## 2022-09-21 ENCOUNTER — Other Ambulatory Visit: Payer: Self-pay

## 2022-09-21 ENCOUNTER — Emergency Department (HOSPITAL_COMMUNITY)
Admission: EM | Admit: 2022-09-21 | Discharge: 2022-09-21 | Disposition: A | Discharge status: Home / Self Care | Payer: Medicare HMO | Attending: Emergency Medicine | Admitting: Emergency Medicine

## 2022-09-21 ENCOUNTER — Encounter (HOSPITAL_COMMUNITY): Payer: Self-pay

## 2022-09-21 ENCOUNTER — Emergency Department (HOSPITAL_COMMUNITY): Payer: Medicare HMO

## 2022-09-21 DIAGNOSIS — K5732 Diverticulitis of large intestine without perforation or abscess without bleeding: Secondary | ICD-10-CM | POA: Diagnosis not present

## 2022-09-21 DIAGNOSIS — Z7951 Long term (current) use of inhaled steroids: Secondary | ICD-10-CM | POA: Insufficient documentation

## 2022-09-21 DIAGNOSIS — J449 Chronic obstructive pulmonary disease, unspecified: Secondary | ICD-10-CM | POA: Diagnosis not present

## 2022-09-21 DIAGNOSIS — Z79899 Other long term (current) drug therapy: Secondary | ICD-10-CM | POA: Diagnosis not present

## 2022-09-21 DIAGNOSIS — K5792 Diverticulitis of intestine, part unspecified, without perforation or abscess without bleeding: Secondary | ICD-10-CM

## 2022-09-21 DIAGNOSIS — I10 Essential (primary) hypertension: Secondary | ICD-10-CM | POA: Insufficient documentation

## 2022-09-21 DIAGNOSIS — K8020 Calculus of gallbladder without cholecystitis without obstruction: Secondary | ICD-10-CM

## 2022-09-21 DIAGNOSIS — Z7982 Long term (current) use of aspirin: Secondary | ICD-10-CM | POA: Insufficient documentation

## 2022-09-21 DIAGNOSIS — R002 Palpitations: Secondary | ICD-10-CM | POA: Diagnosis not present

## 2022-09-21 DIAGNOSIS — R079 Chest pain, unspecified: Secondary | ICD-10-CM | POA: Diagnosis present

## 2022-09-21 LAB — COMPREHENSIVE METABOLIC PANEL
ALT: 33 U/L (ref 0–44)
AST: 64 U/L — ABNORMAL HIGH (ref 15–41)
Albumin: 3.1 g/dL — ABNORMAL LOW (ref 3.5–5.0)
Alkaline Phosphatase: 76 U/L (ref 38–126)
Anion gap: 11 (ref 5–15)
BUN: 11 mg/dL (ref 8–23)
CO2: 21 mmol/L — ABNORMAL LOW (ref 22–32)
Calcium: 9.3 mg/dL (ref 8.9–10.3)
Chloride: 107 mmol/L (ref 98–111)
Creatinine, Ser: 0.93 mg/dL (ref 0.44–1.00)
GFR, Estimated: >60 mL/min (ref >60)
Glucose, Bld: 142 mg/dL — ABNORMAL HIGH (ref 70–99)
Potassium: 3.5 mmol/L (ref 3.5–5.1)
Sodium: 139 mmol/L (ref 135–145)
Total Bilirubin: 3.3 mg/dL — ABNORMAL HIGH (ref 0.3–1.2)
Total Protein: 7.2 g/dL (ref 6.5–8.1)

## 2022-09-21 LAB — CBC WITH DIFFERENTIAL/PLATELET
Abs Immature Granulocytes: 0.02 10*3/uL (ref 0.00–0.07)
Basophils Absolute: 0.1 10*3/uL (ref 0.0–0.1)
Basophils Relative: 2 %
Eosinophils Absolute: 0.1 10*3/uL (ref 0.0–0.5)
Eosinophils Relative: 2 %
HCT: 39.8 % (ref 36.0–46.0)
Hemoglobin: 13.7 g/dL (ref 12.0–15.0)
Immature Granulocytes: 1 %
Lymphocytes Relative: 19 %
Lymphs Abs: 0.7 10*3/uL (ref 0.7–4.0)
MCH: 31.9 pg (ref 26.0–34.0)
MCHC: 34.4 g/dL (ref 30.0–36.0)
MCV: 92.6 fL (ref 80.0–100.0)
Monocytes Absolute: 0.3 10*3/uL (ref 0.1–1.0)
Monocytes Relative: 8 %
Neutro Abs: 2.6 10*3/uL (ref 1.7–7.7)
Neutrophils Relative %: 68 %
Platelets: 70 10*3/uL — ABNORMAL LOW (ref 150–400)
RBC: 4.3 MIL/uL (ref 3.87–5.11)
RDW: 13.2 % (ref 11.5–15.5)
WBC: 3.8 10*3/uL — ABNORMAL LOW (ref 4.0–10.5)
nRBC: 0 % (ref 0.0–0.2)

## 2022-09-21 LAB — TROPONIN I (HIGH SENSITIVITY)
Troponin I (High Sensitivity): 6 ng/L (ref <18)
Troponin I (High Sensitivity): 5 ng/L (ref <18)

## 2022-09-21 LAB — LIPASE, BLOOD: Lipase: 62 U/L — ABNORMAL HIGH (ref 11–51)

## 2022-09-21 MED ORDER — IOHEXOL 350 MG/ML SOLN
75.0000 mL | Freq: Once | INTRAVENOUS | Status: AC | PRN
  Administered 2022-09-21: 75 mL via INTRAVENOUS

## 2022-09-21 MED ORDER — ONDANSETRON 8 MG PO TBDP: 8.0000 mg | ORAL_TABLET | Freq: Three times a day (TID) | ORAL | 0 refills | Status: DC | PRN

## 2022-09-21 MED ORDER — ONDANSETRON 8 MG PO TBDP: 8.0000 mg | ORAL_TABLET | Freq: Three times a day (TID) | ORAL | 0 refills | Status: AC | PRN

## 2022-09-21 MED ORDER — LACTATED RINGERS IV BOLUS
1000.0000 mL | Freq: Once | INTRAVENOUS | Status: AC
  Administered 2022-09-21: 1000 mL via INTRAVENOUS

## 2022-09-21 MED ORDER — AMOXICILLIN-POT CLAVULANATE 875-125 MG PO TABS: 1.0000 | ORAL_TABLET | Freq: Two times a day (BID) | ORAL | 0 refills | Status: AC

## 2022-09-21 MED ORDER — AMOXICILLIN-POT CLAVULANATE 875-125 MG PO TABS
1.0000 | ORAL_TABLET | Freq: Two times a day (BID) | ORAL | 0 refills | Status: DC
Start: 2022-09-21 — End: 2022-09-21

## 2022-09-21 MED ORDER — ONDANSETRON 4 MG PO TBDP
8.0000 mg | ORAL_TABLET | Freq: Once | ORAL | Status: AC
  Administered 2022-09-21: 8 mg via ORAL
  Filled 2022-09-21: qty 2

## 2022-09-21 MED ORDER — METOCLOPRAMIDE HCL 5 MG/ML IJ SOLN
10.0000 mg | Freq: Once | INTRAMUSCULAR | Status: AC
  Administered 2022-09-21: 10 mg via INTRAVENOUS
  Filled 2022-09-21: qty 2

## 2022-09-21 NOTE — ED Provider Notes (Addendum)
  Physical Exam  BP (!) 115/54   Pulse (!) 51   Temp (!) 96.9 F (36.1 C) (Rectal)   Resp 12   Ht 5' 1"$  (1.549 m)   Wt 93.9 kg   SpO2 98%   BMI 39.11 kg/m   Physical Exam  Procedures  Procedures  ED Course / MDM    Medical Decision Making Amount and/or Complexity of Data Reviewed Labs: ordered. Radiology: ordered.  Risk Prescription drug management.   77 y/o F w/ copd, htn, hl. Pt had some Chest pain radiates to back and L upper extremity numbness. Also mentioned lower extremity pain and palpitations.  She is getting CT-chest and abd. Trops ordered.  Per Dr. Dayna Barker, patient can be discharged with Cards f.u if her symptoms are negative.  11:29 AM I reviewed the CT scan.  Patient has diverticulitis, cholelithiasis.  She complains of abdominal pain, including right-sided abdominal pain that is   Will give her GI intermittent.  Will give her surgery follow-up.  Will also give her Augmentin for diverticulitis.  Additionally, patient complains of weakness.  She also informed me that she was told she had A-fib.  On telemetry monitoring she has had irregular heartbeat but no evidence of A-fib.  Advised PCP follow-up or cardiology follow-up for Holter monitoring.          Varney Biles, MD 09/21/22 [id number]    Varney Biles, MD 09/21/22 1130

## 2022-09-21 NOTE — Discharge Instructions (Addendum)
You are seen in the ER for weakness, abdominal pain, chest pain.  Your workup is overall reassuring.  We noted that you have gallstones.  Please follow-up with general surgery given that you have had some right-sided abdominal pain.  You are noted to have diverticulitis.  Please take the antibiotics that are prescribed.  Although on our monitoring we noticed that you have had a irregular heartbeat, we did not see any clear evidence of atrial fibrillation.  Please follow-up with your cardiologist to see they can give you a Holter monitor that can give you a diagnosis of A-fib.  We recommend that you taper off of Ambien by taking half the dose for the next 2 weeks, then quarter of the dose for 2 weeks and then discontinuing it.

## 2022-09-21 NOTE — ED Provider Notes (Signed)
[facility] Provider Note   CSN: [id number] Arrival date & time: 09/21/22  0530     History  Chief Complaint  Patient presents with   Chest Pain    Donna Duran is a 77 y.o. female.  Patient with a history of insomnia on Ambien, COPD, hypertension, hyperlipidemia who presents the ER today for multiple symptoms.  Patient initially talks about how she has been no sleep since stopping her Ambien on Monday.  Had 1 hour sleep tonight.  She talks about when she woke up from this hour of sleep she had left arm paresthesias numbness and heaviness.  She was worried about her heart but she also thought maybe she slept on it wrong so she got up to move around I did not seem to getting better.  She started to have some back pain.  It was not sharp or stabbing but more just there.  That she started have some nausea and some clamminess and just felt unwell generally.  All the symptoms worried her so she called EMS.  EMS gave her aspirin nitroglycerin.  She still feels unwell.  They also gave her Zofran.  She has not vomited but still feels nauseous.  She also states that now both of her legs are hurting.   Chest Pain      Home Medications Prior to Admission medications   Medication Sig Start Date End Date Taking? Authorizing Provider  albuterol (PROVENTIL HFA;VENTOLIN HFA) 108 (90 Base) MCG/ACT inhaler INHALE 2 PUFFS INTO THE LUNGS EVERY 6 HOURS AS NEEDED FOR WHEEZING 04/29/18   Mannam, Praveen, MD  aspirin 81 MG tablet Take 81 mg by mouth daily.     [provider]  Cholecalciferol (VITAMIN D) 2000 UNITS tablet Take 2,000 Units by mouth daily.    [provider]  FLUoxetine (PROZAC) 20 MG capsule Take 40 mg by mouth daily.     [provider]  fluticasone furoate-vilanterol (BREO ELLIPTA) 100-25 MCG/INH AEPB Inhale 1 puff into the lungs daily as needed (sob and wheezing).    [provider]   Fluticasone-Umeclidin-Vilant 100-62.5-25 MCG/INH AEPB Inhale 1 puff into the lungs daily.    [provider]  folic acid (FOLVITE) 1 MG tablet Take 1 tablet (1 mg total) by mouth daily. 01/19/20   Orson Slick, MD  ipratropium-albuterol (DUONEB) 0.5-2.5 (3) MG/3ML SOLN Inhale 3 mLs into the lungs daily.  01/06/18   [provider]  traMADol (ULTRAM) 50 MG tablet Take 50 mg by mouth every 6 (six) hours as needed for moderate pain.     [provider]  vitamin B-12 (CYANOCOBALAMIN) 500 MCG tablet Take 2 tablets (1,000 mcg total) by mouth daily. 01/19/20   Orson Slick, MD  zolpidem (AMBIEN) 10 MG tablet Take 10 mg by mouth at bedtime.    [provider]      Allergies    Morphine and related    Review of Systems   Review of Systems  Cardiovascular:  Positive for chest pain.    Physical Exam Updated Vital Signs BP (!) 115/54   Pulse (!) 51   Temp (!) 96.9 F (36.1 C) (Rectal)   Resp 12   Ht '5\' 1"'$  (1.549 m)   Wt 93.9 kg   SpO2 98%   BMI 39.11 kg/m  Physical Exam Vitals and nursing note reviewed.  Constitutional:      Appearance: She is well-developed.  HENT:  Head: Normocephalic and atraumatic.     Mouth/Throat:     Mouth: Mucous membranes are moist.  Eyes:     Pupils: Pupils are equal, round, and reactive to light.  Cardiovascular:     Rate and Rhythm: Normal rate and regular rhythm.     Pulses:          Radial pulses are 2+ on the right side and 2+ on the left side.       Dorsalis pedis pulses are 2+ on the right side and 2+ on the left side.  Pulmonary:     Effort: No respiratory distress.     Breath sounds: No stridor.  Abdominal:     General: Abdomen is flat. There is no distension.  Musculoskeletal:     Cervical back: Normal range of motion.     Right lower leg: No edema.  Skin:    General: Skin is warm and dry.  Neurological:     General: No focal deficit present.     Mental Status: She is alert.     ED  Results / Procedures / Treatments   Labs (all labs ordered are listed, but only abnormal results are displayed) Labs Reviewed  CBC WITH DIFFERENTIAL/PLATELET  COMPREHENSIVE METABOLIC PANEL  LIPASE, BLOOD  TROPONIN I (HIGH SENSITIVITY)    EKG EKG Interpretation  Date/Time:  Friday September 21 2022 05:40:41 EST Ventricular Rate:  70 PR Interval:  163 QRS Duration: 106 QT Interval:  542 QTC Calculation: 585 R Axis:   -6 Text Interpretation: Sinus rhythm Atrial premature complex Nonspecific T abnormalities, lateral leads Prolonged QT interval Confirmed by Merrily Pew [phone]) on 09/21/2022 5:44:25 AM  Radiology No results found.  Procedures Procedures    Medications Ordered in ED Medications  metoCLOPramide (REGLAN) injection 10 mg (10 mg Intravenous Given 09/21/22 0612)  lactated ringers bolus 1,000 mL (1,000 mLs Intravenous New Bag/Given 09/21/22 0615)    ED Course/ Medical Decision Making/ A&P                             Medical Decision Making Amount and/or Complexity of Data Reviewed Labs: ordered. Radiology: ordered.  Risk Prescription drug management.  Will rule out ACS with delta troponins or EKG is normal.  Also consider PE/dissection so we will get a CT scan.  Also consider possible pancreatitis versus biliary disease with the nausea and clamminess so we will get a CT of the abdomen pelvis.  Will treat with a liter of fluids and some nausea medication for now.  She is already had aspirin and nitroglycerin and her chest pain seems to have gotten better with that. Care trnasferred pendign complete workup. Suspect likely d/c if negative and improving but will require reevaluation for that disposition.    Final Clinical Impression(s) / ED Diagnoses Final diagnoses:  None    Rx / DC Orders ED Discharge Orders     None         Mariadelaluz [place], Corene Cornea, MD 09/21/22 [id number]

## 2022-09-21 NOTE — ED Triage Notes (Signed)
Patient arrives via ems from home secondary to chest discomfort that irradiates towards her back. Aspirin 324 mg 0.4 mg nitro and 4 mg zofran was given prior arrival.

## 2022-09-21 NOTE — ED Notes (Signed)
Patient assisted to chair for comfort

## 2024-05-30 DEATH — deceased
